# Patient Record
Sex: Male | Born: 1990
Health system: Southern US, Community
[De-identification: ages and names within clinical notes are randomized; demographics above are authoritative.]

---

## 2018-04-25 DIAGNOSIS — J101 Influenza due to other identified influenza virus with other respiratory manifestations: Secondary | ICD-10-CM | POA: Diagnosis not present

## 2018-04-29 DIAGNOSIS — J019 Acute sinusitis, unspecified: Secondary | ICD-10-CM | POA: Diagnosis not present

## 2018-04-29 DIAGNOSIS — R05 Cough: Secondary | ICD-10-CM | POA: Diagnosis not present

## 2018-05-02 ENCOUNTER — Emergency Department (HOSPITAL_COMMUNITY)
Admission: EM | Admit: 2018-05-02 | Discharge: 2018-05-02 | Disposition: A | Discharge status: Home / Self Care | Payer: 59 | Attending: Emergency Medicine | Admitting: Emergency Medicine

## 2018-05-02 ENCOUNTER — Emergency Department (HOSPITAL_COMMUNITY): Payer: 59

## 2018-05-02 ENCOUNTER — Encounter (HOSPITAL_COMMUNITY): Payer: Self-pay | Admitting: Emergency Medicine

## 2018-05-02 ENCOUNTER — Other Ambulatory Visit: Payer: Self-pay

## 2018-05-02 DIAGNOSIS — F419 Anxiety disorder, unspecified: Secondary | ICD-10-CM | POA: Insufficient documentation

## 2018-05-02 DIAGNOSIS — G4700 Insomnia, unspecified: Secondary | ICD-10-CM | POA: Diagnosis not present

## 2018-05-02 DIAGNOSIS — F430 Acute stress reaction: Secondary | ICD-10-CM | POA: Diagnosis not present

## 2018-05-02 DIAGNOSIS — F5101 Primary insomnia: Secondary | ICD-10-CM | POA: Diagnosis not present

## 2018-05-02 DIAGNOSIS — R4020 Unspecified coma: Secondary | ICD-10-CM | POA: Diagnosis not present

## 2018-05-02 LAB — CBC WITH DIFFERENTIAL/PLATELET
Abs Immature Granulocytes: 0.06 10*3/uL (ref 0.00–0.07)
Basophils Absolute: 0 10*3/uL (ref 0.0–0.1)
Basophils Relative: 0 %
EOS ABS: 0 10*3/uL (ref 0.0–0.5)
Eosinophils Relative: 0 %
HEMATOCRIT: 51.4 % (ref 39.0–52.0)
Hemoglobin: 17.5 g/dL — ABNORMAL HIGH (ref 13.0–17.0)
IMMATURE GRANULOCYTES: 1 %
Lymphocytes Relative: 12 %
Lymphs Abs: 1.4 10*3/uL (ref 0.7–4.0)
MCH: 28.3 pg (ref 26.0–34.0)
MCHC: 34 g/dL (ref 30.0–36.0)
MCV: 83 fL (ref 80.0–100.0)
Monocytes Absolute: 1 10*3/uL (ref 0.1–1.0)
Monocytes Relative: 8 %
NEUTROS PCT: 79 %
Neutro Abs: 9.3 10*3/uL — ABNORMAL HIGH (ref 1.7–7.7)
PLATELETS: 307 10*3/uL (ref 150–400)
RBC: 6.19 MIL/uL — ABNORMAL HIGH (ref 4.22–5.81)
RDW: 11.6 % (ref 11.5–15.5)
WBC: 11.8 10*3/uL — ABNORMAL HIGH (ref 4.0–10.5)
nRBC: 0 % (ref 0.0–0.2)

## 2018-05-02 LAB — URINALYSIS, ROUTINE W REFLEX MICROSCOPIC
GLUCOSE, UA: NEGATIVE mg/dL
HGB URINE DIPSTICK: NEGATIVE
Ketones, ur: 20 mg/dL — AB
LEUKOCYTES UA: NEGATIVE
Nitrite: NEGATIVE
PROTEIN: NEGATIVE mg/dL
SPECIFIC GRAVITY, URINE: 1.039 — AB (ref 1.005–1.030)
pH: 5 (ref 5.0–8.0)

## 2018-05-02 LAB — COMPREHENSIVE METABOLIC PANEL
ALBUMIN: 4.7 g/dL (ref 3.5–5.0)
ALT: 140 U/L — ABNORMAL HIGH (ref 0–44)
ANION GAP: 12 (ref 5–15)
AST: 59 U/L — AB (ref 15–41)
Alkaline Phosphatase: 85 U/L (ref 38–126)
BILIRUBIN TOTAL: 1.2 mg/dL (ref 0.3–1.2)
BUN: 16 mg/dL (ref 6–20)
CHLORIDE: 105 mmol/L (ref 98–111)
CO2: 22 mmol/L (ref 22–32)
Calcium: 9.5 mg/dL (ref 8.9–10.3)
Creatinine, Ser: 0.91 mg/dL (ref 0.61–1.24)
GFR calc Af Amer: >60 mL/min (ref >60)
GFR calc non Af Amer: >60 mL/min (ref >60)
GLUCOSE: 121 mg/dL — AB (ref 70–99)
POTASSIUM: 3.8 mmol/L (ref 3.5–5.1)
SODIUM: 139 mmol/L (ref 135–145)
Total Protein: 7.9 g/dL (ref 6.5–8.1)

## 2018-05-02 LAB — ETHANOL

## 2018-05-02 LAB — RAPID URINE DRUG SCREEN, HOSP PERFORMED
AMPHETAMINES: NOT DETECTED
BENZODIAZEPINES: NOT DETECTED
Barbiturates: NOT DETECTED
Cocaine: NOT DETECTED
Opiates: NOT DETECTED
TETRAHYDROCANNABINOL: NOT DETECTED

## 2018-05-02 MED ORDER — ZOLPIDEM TARTRATE 5 MG PO TABS: 5.0000 mg | ORAL_TABLET | Freq: Every evening | ORAL | 0 refills | Status: DC | PRN

## 2018-05-02 NOTE — Discharge Instructions (Addendum)
Your liver function tests were mildly elevated Follow up with a primary care doctor

## 2018-05-02 NOTE — ED Triage Notes (Signed)
Pt reports taking Melatonin and Nyquil without improvement.

## 2018-05-02 NOTE — ED Triage Notes (Signed)
Pt family reports that he was diagnosed with the flu over a week ago and has not been able to sleep since 04/27/18. Pt very slow to respond to questions at time of triage.

## 2018-05-02 NOTE — ED Provider Notes (Signed)
MSE was initiated and I personally evaluated the patient and placed orders (if any) at  9:19 PM on May 02, 2018.  The patient appears stable so that the remainder of the MSE may be completed by another provider.  Patient presents today with his wife for evaluation of not being able to sleep since 04/27/2018.  He was diagnosed over for a week ago with the flu and was given Tamiflu.  He is very slow to respond to questions.  History is mostly provided by patient's wife.  He does not have any known mental health history, denies any drug use.  He tells me that he has been seeing things and hearing things that he does not know if they are really there.  His wife reports that he is able to sleep for an hour here and an hour they are but has not had a consistent night of sleep in 5 days.  On my exam patient is awake, he is very slow to respond to questioning, but will answer questions appropriately.    CT head ordered, in addition to labs.  I stressed to patient and his wife the importance of staying for full evaluation and they stated their understanding.   Norman ClayHammond, Valine Drozdowski W, PA-C 05/02/18 2121    Lorre NickAllen, Anthony, MD 05/02/18 [phone]

## 2018-05-02 NOTE — ED Provider Notes (Signed)
[facility]-EMERGENCY DEPT Provider Note   CSN: [id number] Arrival date & time: 05/02/18  1910     History   Chief Complaint Chief Complaint  Patient presents with  . Insomnia    HPI James Burgess is a 27 y.o. male.  This is a 27 year old male who presents with insomnia x5 days.  Patient states he has been under a great deal of stress and anxiety recently.  Notes that when he tries a goal of sleep is less x-rays.  Denies any suicidal or homicidal ideations.  No auditory or visual hallucinations.  Denies any use of stimulants or illicit drugs.  Has attempted to use over-the-counter medications without relief.  No prior history of same.     History reviewed. No pertinent past medical history.  There are no active problems to display for this patient.   History reviewed. No pertinent surgical history.      Home Medications    Prior to Admission medications   Not on File    Family History History reviewed. No pertinent family history.  Social History Social History   Tobacco Use  . Smoking status: Never Smoker  . Smokeless tobacco: Never Used  Substance Use Topics  . Alcohol use: Never    Frequency: Never  . Drug use: Never     Allergies   Patient has no known allergies.   Review of Systems Review of Systems  All other systems reviewed and are negative.    Physical Exam Updated Vital Signs BP (!) 147/98 (BP Location: Left Arm)   Pulse 94   Temp 98 F (36.7 C) (Oral)   Resp 17   Ht 1.829 m (6')   Wt 81.6 kg   SpO2 97%   BMI 24.41 kg/m   Physical Exam  Constitutional: He is oriented to person, place, and time. He appears well-developed and well-nourished.  Non-toxic appearance. No distress.  HENT:  Head: Normocephalic and atraumatic.  Eyes: Pupils are equal, round, and reactive to light. Conjunctivae, EOM and lids are normal.  Neck: Normal range of motion. Neck supple. No tracheal deviation present. No thyroid mass  present.  Cardiovascular: Normal rate, regular rhythm and normal heart sounds. Exam reveals no gallop.  No murmur heard. Pulmonary/Chest: Effort normal and breath sounds normal. No stridor. No respiratory distress. He has no decreased breath sounds. He has no wheezes. He has no rhonchi. He has no rales.  Abdominal: Soft. Normal appearance and bowel sounds are normal. He exhibits no distension. There is no tenderness. There is no rebound and no CVA tenderness.  Musculoskeletal: Normal range of motion. He exhibits no edema or tenderness.  Neurological: He is alert and oriented to person, place, and time. He has normal strength. No cranial nerve deficit or sensory deficit. GCS eye subscore is 4. GCS verbal subscore is 5. GCS motor subscore is 6.  Skin: Skin is warm and dry. No abrasion and no rash noted.  Psychiatric: His affect is blunt. His speech is delayed. He is withdrawn.  Nursing note and vitals reviewed.    ED Treatments / Results  Labs (all labs ordered are listed, but only abnormal results are displayed) Labs Reviewed  CBC WITH DIFFERENTIAL/PLATELET - Abnormal; Notable for the following components:      Result Value   WBC 11.8 (*)    RBC 6.19 (*)    Hemoglobin 17.5 (*)    Neutro Abs 9.3 (*)    All other components within normal limits  COMPREHENSIVE METABOLIC PANEL  URINALYSIS, ROUTINE W REFLEX MICROSCOPIC  RAPID URINE DRUG SCREEN, HOSP PERFORMED  ETHANOL    EKG None  Radiology No results found.  Procedures Procedures (including critical care time)  Medications Ordered in ED Medications - No data to display   Initial Impression / Assessment and Plan / ED Course  I have reviewed the triage vital signs and the nursing notes.  Pertinent labs & imaging results that were available during my care of the patient were reviewed by me and considered in my medical decision making (see chart for details).     Pt with mild elevated lfts Instructed to follow up with  pcp Will rx ambien  Final Clinical Impressions(s) / ED Diagnoses   Final diagnoses:  None    ED Discharge Orders    None       Lorre Nick, MD 05/02/18 2240

## 2018-05-03 ENCOUNTER — Encounter (HOSPITAL_COMMUNITY): Payer: Self-pay

## 2018-05-03 ENCOUNTER — Other Ambulatory Visit: Payer: Self-pay

## 2018-05-03 ENCOUNTER — Emergency Department (HOSPITAL_COMMUNITY)
Admission: EM | Admit: 2018-05-03 | Discharge: 2018-05-04 | Disposition: A | Discharge status: Other Acute Inpatient Hospital | Payer: 59 | Attending: Emergency Medicine | Admitting: Emergency Medicine

## 2018-05-03 DIAGNOSIS — F329 Major depressive disorder, single episode, unspecified: Secondary | ICD-10-CM | POA: Diagnosis not present

## 2018-05-03 DIAGNOSIS — F99 Mental disorder, not otherwise specified: Secondary | ICD-10-CM | POA: Diagnosis present

## 2018-05-03 DIAGNOSIS — R45851 Suicidal ideations: Secondary | ICD-10-CM | POA: Diagnosis not present

## 2018-05-03 LAB — CBG MONITORING, ED: Glucose-Capillary: 100 mg/dL — ABNORMAL HIGH (ref 70–99)

## 2018-05-03 LAB — COMPREHENSIVE METABOLIC PANEL
ALBUMIN: 4.6 g/dL (ref 3.5–5.0)
ALT: 163 U/L — AB (ref 0–44)
AST: 68 U/L — AB (ref 15–41)
Alkaline Phosphatase: 81 U/L (ref 38–126)
Anion gap: 11 (ref 5–15)
BILIRUBIN TOTAL: 0.9 mg/dL (ref 0.3–1.2)
BUN: 19 mg/dL (ref 6–20)
CHLORIDE: 99 mmol/L (ref 98–111)
CO2: 27 mmol/L (ref 22–32)
CREATININE: 0.91 mg/dL (ref 0.61–1.24)
Calcium: 9.1 mg/dL (ref 8.9–10.3)
GFR calc Af Amer: >60 mL/min (ref >60)
GFR calc non Af Amer: >60 mL/min (ref >60)
GLUCOSE: 109 mg/dL — AB (ref 70–99)
POTASSIUM: 3.8 mmol/L (ref 3.5–5.1)
Sodium: 137 mmol/L (ref 135–145)
Total Protein: 7.9 g/dL (ref 6.5–8.1)

## 2018-05-03 LAB — RAPID URINE DRUG SCREEN, HOSP PERFORMED
AMPHETAMINES: NOT DETECTED
BARBITURATES: NOT DETECTED
BENZODIAZEPINES: NOT DETECTED
COCAINE: NOT DETECTED
Opiates: NOT DETECTED
TETRAHYDROCANNABINOL: NOT DETECTED

## 2018-05-03 LAB — AMMONIA: Ammonia: 15 umol/L (ref 9–35)

## 2018-05-03 LAB — CBC
HCT: 48.9 % (ref 39.0–52.0)
HEMOGLOBIN: 16.6 g/dL (ref 13.0–17.0)
MCH: 28.3 pg (ref 26.0–34.0)
MCHC: 33.9 g/dL (ref 30.0–36.0)
MCV: 83.3 fL (ref 80.0–100.0)
NRBC: 0 % (ref 0.0–0.2)
Platelets: 329 10*3/uL (ref 150–400)
RBC: 5.87 MIL/uL — ABNORMAL HIGH (ref 4.22–5.81)
RDW: 11.5 % (ref 11.5–15.5)
WBC: 10.9 10*3/uL — AB (ref 4.0–10.5)

## 2018-05-03 LAB — ETHANOL: Alcohol, Ethyl (B): <10 mg/dL (ref <10)

## 2018-05-03 LAB — ACETAMINOPHEN LEVEL: Acetaminophen (Tylenol), Serum: <10 ug/mL — ABNORMAL LOW (ref 10–30)

## 2018-05-03 LAB — SALICYLATE LEVEL: Salicylate Lvl: <7 mg/dL (ref 2.8–30.0)

## 2018-05-03 MED ORDER — AMMONIA AROMATIC IN INHA
RESPIRATORY_TRACT | Status: AC
  Filled 2018-05-03: qty 10

## 2018-05-03 MED ORDER — LORAZEPAM 2 MG/ML IJ SOLN
1.0000 mg | Freq: Once | INTRAMUSCULAR | Status: AC
  Administered 2018-05-03: 1 mg via INTRAMUSCULAR
  Filled 2018-05-03: qty 1

## 2018-05-03 NOTE — ED Notes (Signed)
While speaking with pt, all pt could say "I just want to die". Pt eventually did answer questions about having his parents come back to visit him. At this time, pt does not want to see family members.

## 2018-05-03 NOTE — ED Notes (Signed)
Pt alert and mildly confused. Pt laying in bed. Pt noted making jerking movements. Pt denies any si, hi, and avh at this time. Pt unable to make steady eys contact. Will continue to monitor.

## 2018-05-03 NOTE — ED Triage Notes (Signed)
Pt states, " I want to die, I want to go to jail." Pt was seen yesterday for insomnia.

## 2018-05-03 NOTE — ED Notes (Signed)
[id number] Expected date:  Expected time:  Means of arrival:  Comments: [place]

## 2018-05-03 NOTE — ED Provider Notes (Addendum)
[facility]-EMERGENCY DEPT Provider Note   CSN: [id number] Arrival date & time: 05/03/18  1620     History   Chief Complaint Chief Complaint  Patient presents with  . Suicidal    HPI James Burgess is a 27 y.o. male.  27 year old male here due to expressing thoughts of wanting to hurt himself.  Patient seen by myself yesterday for insomnia x5 days.  He notes increased stress and anxiety recently.  I prescribed patient Ambien which his parents state that he took and did get about 4 5 hours of sleep.  When he awoke patient was alert and oriented x3 but yet was very restless and expressed that he does want to die.  The patient himself does not have any history of psychiatric disorder but mother states that she does have a history of anxiety and that she presented very similar to this around his age.  Patient will not give any history currently due to his current state     History reviewed. No pertinent past medical history.  There are no active problems to display for this patient.   History reviewed. No pertinent surgical history.      Home Medications    Prior to Admission medications   Medication Sig Start Date End Date Taking? Authorizing Provider  zolpidem (AMBIEN) 5 MG tablet Take 1 tablet (5 mg total) by mouth at bedtime as needed for sleep. 05/02/18   Lorre NickAllen, Hasel Janish, MD    Family History History reviewed. No pertinent family history.  Social History Social History   Tobacco Use  . Smoking status: Never Smoker  . Smokeless tobacco: Never Used  Substance Use Topics  . Alcohol use: Never    Frequency: Never  . Drug use: Never     Allergies   Patient has no known allergies.   Review of Systems Review of Systems  Unable to perform ROS: Psychiatric disorder     Physical Exam Updated Vital Signs BP (!) 151/90 (BP Location: Right Arm)   Pulse 72   Temp 98.3 F (36.8 C) (Oral)   Resp 16   SpO2 98%   Physical Exam    Constitutional: He appears well-developed and well-nourished. He appears lethargic.  Non-toxic appearance. No distress.  HENT:  Head: Normocephalic and atraumatic.  Eyes: Pupils are equal, round, and reactive to light. Conjunctivae, EOM and lids are normal.  Neck: Normal range of motion. Neck supple. No tracheal deviation present. No thyroid mass present.  Cardiovascular: Normal rate, regular rhythm and normal heart sounds. Exam reveals no gallop.  No murmur heard. Pulmonary/Chest: Effort normal and breath sounds normal. No stridor. No respiratory distress. He has no decreased breath sounds. He has no wheezes. He has no rhonchi. He has no rales.  Abdominal: Soft. Normal appearance and bowel sounds are normal. He exhibits no distension. There is no tenderness. There is no rebound and no CVA tenderness.  Musculoskeletal: Normal range of motion. He exhibits no edema or tenderness.  Neurological: He appears lethargic. He displays no tremor. No sensory deficit. He exhibits normal muscle tone. GCS eye subscore is 4. GCS verbal subscore is 3. GCS motor subscore is 5.  Patient uncooperative with exam  Skin: Skin is warm and dry. No abrasion and no rash noted.  Psychiatric: He is inattentive.  Nursing note and vitals reviewed.    ED Treatments / Results  Labs (all labs ordered are listed, but only abnormal results are displayed) Labs Reviewed  CBG MONITORING, ED - Abnormal; Notable  for the following components:      Result Value   Glucose-Capillary 100 (*)    All other components within normal limits  COMPREHENSIVE METABOLIC PANEL  ETHANOL  SALICYLATE LEVEL  ACETAMINOPHEN LEVEL  CBC  RAPID URINE DRUG SCREEN, HOSP PERFORMED  AMMONIA    EKG None  Radiology Ct Head Wo Contrast  Result Date: 05/02/2018 CLINICAL DATA:  Altered LOC EXAM: CT HEAD WITHOUT CONTRAST TECHNIQUE: Contiguous axial images were obtained from the base of the skull through the vertex without intravenous contrast.  COMPARISON:  None. FINDINGS: Brain: No evidence of acute infarction, hemorrhage, hydrocephalus, extra-axial collection or mass lesion/mass effect. Vascular: No hyperdense vessel or unexpected calcification. Skull: Normal. Negative for fracture or focal lesion. Sinuses/Orbits: No acute finding. Other: None IMPRESSION: Negative non contrasted CT appearance of the brain Electronically Signed   By: Jasmine Pang M.D.   On: 05/02/2018 21:57    Procedures Procedures (including critical care time)  Medications Ordered in ED Medications  LORazepam (ATIVAN) injection 1 mg (has no administration in time range)     Initial Impression / Assessment and Plan / ED Course  I have reviewed the triage vital signs and the nursing notes.  Pertinent labs & imaging results that were available during my care of the patient were reviewed by me and considered in my medical decision making (see chart for details).     Patient's mild transaminitis is essentially unchanged.  Patient had a head CT yesterday which was negative.  His ammonia level is negative.  Suspect psychiatric etiology of his current symptoms.  Patient medically cleared for psychiatric disposition  7:19 PM Patient rechecked and he is more conversational now after receiving Ativan.  Final Clinical Impressions(s) / ED Diagnoses   Final diagnoses:  None    ED Discharge Orders    None       Lorre Nick, MD 05/03/18 1901    Lorre Nick, MD 05/03/18 1920

## 2018-05-03 NOTE — ED Notes (Signed)
Nurse was bringing blood draw supplies and a chair down the hall to draw patient's lab samples.  Patient was standing initially but when he saw the chair he sat down in it.  Nurse and tech proceeded to roll him into room, then patient started sliding out of chair.  Nurse and tech lowered him to the floor and patient's body went limp.  Patient's eyes were open and blinking occasionally.  Dr. Freida BusmanAllen notified and vitals and CBG obtained.  Dr. Freida BusmanAllen back in unit assessing patient.  Labs drawn by nurse and warm blanket put on patient.  Patient is non-verbal at this time and having jerking motions to his upper torso while Dr. Freida BusmanAllen in the room.

## 2018-05-04 ENCOUNTER — Inpatient Hospital Stay (HOSPITAL_COMMUNITY)
Admission: AD | Admit: 2018-05-04 | Discharge: 2018-05-09 | DRG: 885 | Disposition: A | Discharge status: Home / Self Care | Payer: 59 | Source: Intra-hospital | Attending: Emergency Medicine | Admitting: Emergency Medicine

## 2018-05-04 ENCOUNTER — Encounter (HOSPITAL_COMMUNITY): Payer: Self-pay

## 2018-05-04 DIAGNOSIS — F2081 Schizophreniform disorder: Principal | ICD-10-CM | POA: Diagnosis present

## 2018-05-04 DIAGNOSIS — R197 Diarrhea, unspecified: Secondary | ICD-10-CM | POA: Diagnosis not present

## 2018-05-04 DIAGNOSIS — G4700 Insomnia, unspecified: Secondary | ICD-10-CM | POA: Diagnosis not present

## 2018-05-04 DIAGNOSIS — F23 Brief psychotic disorder: Secondary | ICD-10-CM | POA: Diagnosis not present

## 2018-05-04 DIAGNOSIS — R509 Fever, unspecified: Secondary | ICD-10-CM | POA: Diagnosis not present

## 2018-05-04 DIAGNOSIS — F329 Major depressive disorder, single episode, unspecified: Secondary | ICD-10-CM | POA: Diagnosis not present

## 2018-05-04 DIAGNOSIS — Z818 Family history of other mental and behavioral disorders: Secondary | ICD-10-CM

## 2018-05-04 DIAGNOSIS — F419 Anxiety disorder, unspecified: Secondary | ICD-10-CM | POA: Diagnosis not present

## 2018-05-04 MED ORDER — HYDROXYZINE HCL 25 MG PO TABS
25.0000 mg | ORAL_TABLET | Freq: Three times a day (TID) | ORAL | Status: DC | PRN
  Administered 2018-05-04: 25 mg via ORAL
  Filled 2018-05-04 (×2): qty 1

## 2018-05-04 MED ORDER — TRAZODONE HCL 50 MG PO TABS
50.0000 mg | ORAL_TABLET | Freq: Every evening | ORAL | Status: DC | PRN
  Administered 2018-05-04: 50 mg via ORAL
  Filled 2018-05-04 (×2): qty 1

## 2018-05-04 MED ORDER — OLANZAPINE 5 MG PO TBDP
5.0000 mg | ORAL_TABLET | Freq: Once | ORAL | Status: AC
  Administered 2018-05-04: 5 mg via ORAL
  Filled 2018-05-04 (×2): qty 1

## 2018-05-04 MED ORDER — ALUM & MAG HYDROXIDE-SIMETH 200-200-20 MG/5ML PO SUSP: 30.0000 mL | ORAL | Status: DC | PRN

## 2018-05-04 MED ORDER — ACETAMINOPHEN 325 MG PO TABS
650.0000 mg | ORAL_TABLET | Freq: Four times a day (QID) | ORAL | Status: DC | PRN
  Administered 2018-05-06: 650 mg via ORAL
  Filled 2018-05-04: qty 2

## 2018-05-04 MED ORDER — MAGNESIUM HYDROXIDE 400 MG/5ML PO SUSP: 30.0000 mL | Freq: Every day | ORAL | Status: DC | PRN

## 2018-05-04 NOTE — ED Notes (Signed)
Nira ConnJason Berry, NP, patient meets inpatient criteria. [facility] to secure placement.

## 2018-05-04 NOTE — Tx Team (Signed)
Initial Treatment Plan 05/04/2018 2:45 PM James RoanMatthew Vanderstelt [id number]    PATIENT STRESSORS: Health problems   PATIENT STRENGTHS: Average or above average intelligence Capable of independent living Supportive family/friends   PATIENT IDENTIFIED PROBLEMS: "stop hearing things"  " sleep"                   DISCHARGE CRITERIA:  Ability to meet basic life and health needs Improved stabilization in mood, thinking, and/or behavior Medical problems require only outpatient monitoring  PRELIMINARY DISCHARGE PLAN: Return to previous living arrangement  PATIENT/FAMILY INVOLVEMENT: This treatment plan has been presented to and reviewed with the patient, James Burgess.  The patient and family have been given the opportunity to ask questions and make suggestions.  Raylene MiyamotoMichael R Floella Ensz, RN 05/04/2018, 2:45 PM

## 2018-05-04 NOTE — ED Notes (Signed)
Pt was observed to be sitting on the floow in hallway outside his room. Once fire alarm in building was activated, pt was redirected into room in order to close door to comply with fire safety. Pt slowly pushed himself by his arms (while remaining on floor) into his room. Pt remained on floor next to bed. This Clinical research associatewriter prompted pt to get up and sit in chair. Pt stared blankly at this writer and continued to sit on floor.

## 2018-05-04 NOTE — ED Notes (Signed)
Pt discharged safely with [facility] .  All belongings were sent with pt.

## 2018-05-04 NOTE — ED Notes (Addendum)
Pt walking in hallway. Pt was redirected from going into an empty room. Pt appears confused, unable to form complete sentences when trying to speak with Clinical research associatewriter. Pt continues to bring water pitcher to this writer even though he was just given fresh water. This Clinical research associatewriter asked pt what he was in need of; pt was unable to answer, appears to be thought blocking. Pt is alert and does respond to this writers questions by looking at Clinical research associatewriter.

## 2018-05-04 NOTE — BH Assessment (Signed)
Assessment Note  James Burgess is an 27 y.o. male presenting with SI. Clinician attempted to complete assessment questions. Patient stated, "Shannan Harper me, I want you to kill me... put me in jail", clinician asked why and if patient has been suicidal or homicidal, patient stated, "this is a confessional". Patient wasn't able to tell me what he was confessing, stating, "I don't know what I did, I have no recollection I blacked out". Patient then only made statements over and over stating, "I am weak I am not functioning, I am weak". Patient became unable to assess, as he did not answer anymore questions. Patient laid in bed looking straight up at ceiling blinking his eyes throughout attempted assessment.   Patient was not oriented during assessment. Patient displayed poor eye contact never looking at clinician, staring at the ceiling and blinking eyes repeatedly. Affect and mood was of despair and sadness. Patient judgement was impaired. Patient was repetitive in speech saying statements over and over again.  UDS negative ETOH negative  EDP Note: 27 year old male here due to expressing thoughts of wanting to hurt himself.  Patient seen by myself yesterday for insomnia x5 days.  He notes increased stress and anxiety recently.  I prescribed patient Ambien which his parents state that he took and did get about 4 5 hours of sleep.  When he awoke patient was alert and oriented x3 but yet was very restless and expressed that he does want to die.  The patient himself does not have any history of psychiatric disorder but mother states that she does have a history of anxiety and that she presented very similar to this around his age.  Patient will not give any history currently due to his current state.  Diagnosis: Major Depressive Disorder  Past Medical History: History reviewed. No pertinent past medical history.  History reviewed. No pertinent surgical history.  Family History: History reviewed. No pertinent  family history.  Social History:  reports that he has never smoked. He has never used smokeless tobacco. He reports that he does not drink alcohol or use drugs.  Additional Social History:  Alcohol / Drug Use Pain Medications: see MAR Prescriptions: see MAR Over the Counter: see MAR  CIWA: CIWA-Ar BP: (!) 142/88 Pulse Rate: 88 COWS:    Allergies: No Known Allergies  Home Medications:  (Not in a hospital admission)  OB/GYN Status:  No LMP for male patient.  General Assessment Data Location of Assessment: [facility] TTS Assessment: In system Is this a Tele or Face-to-Face Assessment?: Face-to-Face Is this an Initial Assessment or a Re-assessment for this encounter?: Initial Assessment Patient Accompanied by:: N/A Language Other than English: No Living Arrangements: (unable to assess) What gender do you identify as?: Male Marital status: Single Living Arrangements: Parent Can pt return to current living arrangement?: Yes Admission Status: Voluntary Is patient capable of signing voluntary admission?: Yes Referral Source: Self/Family/Friend     Crisis Care Plan Living Arrangements: Parent Legal Guardian: Mother Name of Psychiatrist: (unable to assess) Name of Therapist: (unable to assess)  Education Status Is patient currently in school?: (unable to assess)  Risk to self with the past 6 months Suicidal Ideation: Yes-Currently Present Has patient been a risk to self within the past 6 months prior to admission? : (unable to assess) Suicidal Intent: Yes-Currently Present Has patient had any suicidal intent within the past 6 months prior to admission? : No Is patient at risk for suicide?: Yes Suicidal Plan?: (unable to assess) Has patient had any  suicidal plan within the past 6 months prior to admission? : (unable to assess) Access to Means: (unable to assess) What has been your use of drugs/alcohol within the last 12 months?: (unable to assess) Previous Attempts/Gestures:  (unable to assess) How many times?: (unable to assess) Other Self Harm Risks: (unable to assess) Triggers for Past Attempts: (unable to assess) Intentional Self Injurious Behavior: (unable to assess) Family Suicide History: Unable to assess Recent stressful life event(s): (unable to assess) Persecutory voices/beliefs?: No Depression: Yes Depression Symptoms: (unable to assess) Substance abuse history and/or treatment for substance abuse?: (unable to assess)  Risk to Others within the past 6 months Homicidal Ideation: (unable to assess) Does patient have any lifetime risk of violence toward others beyond the six months prior to admission? : No Thoughts of Harm to Others: No Current Homicidal Intent: No Current Homicidal Plan: No Access to Homicidal Means: No Identified Victim: (unable to assess) History of harm to others?: No Assessment of Violence: None Noted Violent Behavior Description: (unable to assess) Does patient have access to weapons?: (unable to assess) Criminal Charges Pending?: No Does patient have a court date: No Is patient on probation?: No  Psychosis Hallucinations: (unable to assess) Delusions: Unspecified  Mental Status Report Appearance/Hygiene: Unable to Assess, Unremarkable Eye Contact: Poor Motor Activity: Unable to assess Speech: Logical/coherent Level of Consciousness: Drowsy Mood: Despair Affect: Sad Anxiety Level: Minimal Thought Processes: Irrelevant Judgement: Impaired Orientation: Unable to assess Obsessive Compulsive Thoughts/Behaviors: Unable to Assess  Cognitive Functioning Concentration: Unable to Assess Memory: Unable to Assess Is patient IDD: No Insight: Unable to Assess Impulse Control: Poor Appetite: (unable to assess) Have you had any weight changes? : (unable to assess) Sleep: Unable to Assess Total Hours of Sleep: (unable to assess) Vegetative Symptoms: None  ADLScreening [facility]) Patient's cognitive  ability adequate to safely complete daily activities?: Yes Patient able to express need for assistance with ADLs?: Yes Independently performs ADLs?: Yes (appropriate for developmental age)  Prior Inpatient Therapy Prior Inpatient Therapy: No  Prior Outpatient Therapy Prior Outpatient Therapy: (unable to assess)  ADL Screening (condition at time of admission) Patient's cognitive ability adequate to safely complete daily activities?: Yes Patient able to express need for assistance with ADLs?: Yes Independently performs ADLs?: Yes (appropriate for developmental age)   Disposition:  Disposition Initial Assessment Completed for this Encounter: Yes  Nira ConnJason Berry, NP, patient meets inappatient criteria. [facility], RN, no appropriate beds available. TTS to secure placement.  Burnetta SabinLatisha D Elenore Wanninger, [facility] 05/04/2018 1:55 AM

## 2018-05-04 NOTE — ED Notes (Signed)
Patient appears confused and thought blocking.  Pt stated that he thought he was here for pneumonia.  He also may be experiencing  Visual hallucinations because he stated that " It looks like someone tried to claw there way out of here."  "Like blood streaks everywhere."  Pt was assured of his safety.  We will continue to monitor pt by 15 minute checks and video monitoring.

## 2018-05-04 NOTE — BH Assessment (Signed)
[facility]  Per Juanetta BeetsJacqueline Norman, DO, this pt requires psychiatric hospitalization.  Berneice Heinrichina Tate, RN, [id number]  Dr Sharma CovertNorman also finds that pt meets criteria for IVC, which she has initiated.  [place] [facility] [place], and at [employer]12:42 Magistrate Watts confirms receipt.  He has since faxed Findings and Custody Order to this Clinical research associatewriter.  At 12:50 I called [employer] and spoke to [employer] [id number], who took demographic information, agreeing to dispatch law enforcement to fill out Return of Service.  Completion of Return of Service is pending as of this writing.  Pt's nurse, Kendal Hymendie, has been notified, and agrees to call report to [phone].  Pt is to be transported via Patent examinerlaw enforcement.   Doylene Canninghomas Jachai Okazaki, [place] Behavioral Health Coordinator [phone]

## 2018-05-04 NOTE — Progress Notes (Signed)
Patient ID: James RoanMatthew Kloehn, male   DOB: December 23, 1990, 27 y.o.   MRN: [id number]  Is a 27 yo male that presents IVC on 05/04/18 with increasing thoughts of paranoia, persecution, and what he feels are auditory hallucinations. Pt's girlfriend states that he has kept repeating himself that he thinks he has been abusing her and the cops are coming to get him. He also will hear sounds and think he is the only one hearing them and is hallucinating. Pt seems like he is thought blocking during the assessment and continues to rock back and forth. Pt is unable to sign his paperwork at this time. Most of the information from the interview was obtained from his mother, Gaylyn RongKris and his live in girlfriend, Marchelle Folksmanda. They state that he has had the flu for the past 7 days and took theraflu and nyquil. Pt hasn't been sleeping well from their report. Pt's gf states that he doesn't use/abuse drugs/tobacco/vape/Rx medications. Pt is stated as having a glass of wine "every once in awhile". Pt's mother stated she had a psychotic break at a similar age from anxiety. Pt's family denies that the pt has any present/history of sexual/verbal/sexual abuse. Pt has not had the flu vaccine this year. Pt states he is having pain in his back that is generalized but cannot rate it at this time. Pt has NKA and doesn't take any home meds. Pt has no surgical history. Pt has no PCP but does have a dentist that he saw 8 months ago.   Consents signed, skin/belongings search completed and patient oriented to unit. Patient stable at this time. Patient given the opportunity to express concerns and ask questions. Patient given toiletries. Will continue to monitor.

## 2018-05-05 DIAGNOSIS — G4700 Insomnia, unspecified: Secondary | ICD-10-CM

## 2018-05-05 DIAGNOSIS — F23 Brief psychotic disorder: Secondary | ICD-10-CM

## 2018-05-05 DIAGNOSIS — F419 Anxiety disorder, unspecified: Secondary | ICD-10-CM

## 2018-05-05 MED ORDER — CLONAZEPAM 0.5 MG PO TABS
0.5000 mg | ORAL_TABLET | Freq: Three times a day (TID) | ORAL | Status: DC
  Administered 2018-05-05 – 2018-05-06 (×3): 0.5 mg via ORAL
  Filled 2018-05-05 (×3): qty 1

## 2018-05-05 MED ORDER — RISPERIDONE 1 MG PO TABS
1.0000 mg | ORAL_TABLET | Freq: Two times a day (BID) | ORAL | Status: DC
  Administered 2018-05-05: 1 mg via ORAL
  Filled 2018-05-05 (×3): qty 1

## 2018-05-05 MED ORDER — TEMAZEPAM 15 MG PO CAPS
30.0000 mg | ORAL_CAPSULE | Freq: Every day | ORAL | Status: DC
  Administered 2018-05-06 – 2018-05-08 (×3): 30 mg via ORAL
  Filled 2018-05-05 (×4): qty 2

## 2018-05-05 MED ORDER — RISPERIDONE 2 MG PO TABS
2.0000 mg | ORAL_TABLET | Freq: Two times a day (BID) | ORAL | Status: DC
  Administered 2018-05-05 – 2018-05-06 (×2): 2 mg via ORAL
  Filled 2018-05-05 (×3): qty 1

## 2018-05-05 NOTE — Plan of Care (Signed)
Progress note  D: pt found in bed; allowed to rest. Pt compliant with medication administration. Pt is still delusional and fixated on persecutory notions of being here because of his job. Pt will have tangible conversations but then will digress to thoughts of death or impending doom. Pt denies any si/hi/ah/vh but seems to be responding to internal stimuli or thought blocking at times. Pt agrees to approach staff before [facility].  A: pt provided support and encouragement. Pt given medication per protocol and standing orders. Q5928m safety checks implemented and continued. R: pt safe on the unit; will continue to monitor.   Pt progressing in the following metrics  Problem: Education: Goal: Knowledge of [facility] Education information/materials will improve Outcome: Progressing Goal: Emotional status will improve Outcome: Progressing Goal: Mental status will improve Outcome: Progressing Goal: Verbalization of understanding the information provided will improve Outcome: Progressing

## 2018-05-05 NOTE — Progress Notes (Signed)
Pt observed resting in bed with eyes closed. Respirations even and unlabored. Will accesses needs when Pt wakes up. Will continue with POC.

## 2018-05-05 NOTE — Progress Notes (Signed)
   05/05/18 0500  Precautions / Armbands  Precautions Fall risk;Other (Comment) (unsteady gait; unpredictable behavior )  Patient armbands applied: Patient Identification (White);Fall risk (Yellow)  Adult Fall Risk Assessment  Risk Factor Category (scoring not indicated) High fall risk per protocol (document High fall risk)  Patient Fall Risk Level High fall risk  Adult Fall Risk Interventions  Required Bundle Interventions *See Row Information* High fall risk - low, moderate, and high requirements implemented  Additional Interventions Assess orthostatic BP  Safe Patient Handling Required Documentation-Repositioning Needs  Assist with movement in bed No  Fragile skin with pressure ulcer No  Unresponsive No  Safe Patient Handling Assessment  Ambulates independently Yes, no lift needed  Safe Environment  Patient oriented to unit and equipment Yes

## 2018-05-05 NOTE — Progress Notes (Signed)
Pt was observed walking backwards from room to med window to received meds. Pt states he can only walk backwards. MHT was in proximity to Pt. Pt remains a HFR due to unsteady gait. Will monitor.

## 2018-05-05 NOTE — [facility] Counselor (Signed)
CSW attempted to meet with pt. Pt sleeping heavily and unable to be roused. Per MD, pt would benefit from not being woken up, as he has not slept in several days and was demonstrating psychotic symptoms. CSW will attempt to complete assessment on Friday, 11/29.  Aubrynn Katona S. Alan RipperHolloway, MSW, LCSW Clinical Social Worker 05/05/2018 9:05 AM

## 2018-05-05 NOTE — H&P (Addendum)
Psychiatric Admission Assessment Adult  Patient Identification: James Burgess MRN:  [id number] Date of Evaluation:  05/05/2018 Chief Complaint:  MDD WITH PSYCHOTIC FEATURES                                                                                                                                                                              Principal Diagnosis: Insomnia x5 days/psychotic symptoms/disorganized thought , and behavior/paranoia in the context of a negative drug screen and no prior psychiatric diagnoses as far as we know Diagnosis:  Active Problems:   Brief psychotic disorder (HCC)  History of Present Illness:   Apparently this 27 year old single patient with his normal state of health until he got the flu about a week ago he did take Tamiflu and NyQuil and then began suffering from some insomnia, this progressed to the point of hallucinations, delusional statements, and disorganized behavior noted in the emergency department. Patient acknowledges stress he is unable to give me much valid history. He did express several delusions that while hospitalized in the ER, and he is now sleeping and difficult to arouse. He did receive trazodone and Zyprexa But these were fairly low doses.  When I arouse him he can give me mumbling incoherent answers and falls back asleep  By 10 AM James Burgess was awake.  He was highly anxious.  He stared ahead at the ceiling and he moved his eyes as if he were hallucinating but kept saying he was just looking at the ceiling.  His speech was halting and rare.  He was very anxious and was unaware of how he got here.  He was oriented to person he knew he was hospitalized but states he was here "to be knocked out" he also stated he was "wearing a wire" and asked if there was a wire on him.  Therefore he was resuming some paranoid delusions. He told me he worked at [employer] [place] but could not state his specific job. He did not answer most questions again  was highly anxious and probably hallucinating but was certainly delusional.  Not fully oriented. He denied wanting to harm himself when specifically asked  Associated Signs/Symptoms: Depression Symptoms:  insomnia, (Hypo) Manic Symptoms:  Delusions, Anxiety Symptoms:  ukn Psychotic Symptoms:  Delusions, PTSD Symptoms: ukn Total Time spent with patient: 30 minutes  Past Psychiatric History: ukn  Is the patient at risk to self? Yes.    Has the patient been a risk to self in the past 6 months? No.  Has the patient been a risk to self within the distant past? No.  Is the patient a risk to others? No.  Has the patient been a risk to others in the past 6  months? No.  Has the patient been a risk to others within the distant past? No.   Prior Inpatient Therapy:   Prior Outpatient Therapy:    Alcohol Screening: 1. How often do you have a drink containing alcohol?: Never 2. How many drinks containing alcohol do you have on a typical day when you are drinking?: 1 or 2 3. How often do you have six or more drinks on one occasion?: Never AUDIT-C Score: 0 4. How often during the last year have you found that you were not able to stop drinking once you had started?: Never 5. How often during the last year have you failed to do what was normally expected from you becasue of drinking?: Never 6. How often during the last year have you needed a first drink in the morning to get yourself going after a heavy drinking session?: Never 7. How often during the last year have you had a feeling of guilt of remorse after drinking?: Never 8. How often during the last year have you been unable to remember what happened the night before because you had been drinking?: Never 9. Have you or someone else been injured as a result of your drinking?: No 10. Has a relative or friend or a doctor or another health worker been concerned about your drinking or suggested you cut down?: No Alcohol Use Disorder Identification  Test Final Score (AUDIT): 0 Substance Abuse History in the last 12 months:  No. Consequences of Substance Abuse: Negative Previous Psychotropic Medications: No  Psychological Evaluations: No  Past Medical History: History reviewed. No pertinent past medical history. History reviewed. No pertinent surgical history. Family History: History reviewed. No pertinent family history. Family Psychiatric  History: Mother reports a similar stress reaction with insomnia but I am unable to reach her by phone Tobacco Screening:   Social History:  Social History   Substance and Sexual Activity  Alcohol Use Yes  . Frequency: Never   Comment: occassional glass of wine     Social History   Substance and Sexual Activity  Drug Use Never    Additional Social History:                           Allergies:  No Known Allergies Lab Results:  Results for orders placed or performed during the hospital encounter of 05/03/18 (from the past 48 hour(s))  Rapid urine drug screen (hospital performed)     Status: None   Collection Time: 05/03/18  4:42 PM  Result Value Ref Range   Opiates NONE DETECTED NONE DETECTED   Cocaine NONE DETECTED NONE DETECTED   Benzodiazepines NONE DETECTED NONE DETECTED   Amphetamines NONE DETECTED NONE DETECTED   Tetrahydrocannabinol NONE DETECTED NONE DETECTED   Barbiturates NONE DETECTED NONE DETECTED    Comment: (NOTE) DRUG SCREEN FOR MEDICAL PURPOSES ONLY.  IF CONFIRMATION IS NEEDED FOR ANY PURPOSE, NOTIFY LAB WITHIN 5 DAYS. LOWEST DETECTABLE LIMITS FOR URINE DRUG SCREEN Drug Class                     Cutoff (ng/mL) Amphetamine and metabolites    1000 Barbiturate and metabolites    200 Benzodiazepine                 200 Tricyclics and metabolites     300 Opiates and metabolites        300 Cocaine and metabolites        300 THC  50 Performed at [facility], [address], [place], [place] [address]   CBG  monitoring, ED     Status: Abnormal   Collection Time: 05/03/18  5:09 PM  Result Value Ref Range   Glucose-Capillary 100 (H) 70 - 99 mg/dL  Comprehensive metabolic panel     Status: Abnormal   Collection Time: 05/03/18  5:21 PM  Result Value Ref Range   Sodium 137 135 - 145 mmol/L   Potassium 3.8 3.5 - 5.1 mmol/L   Chloride 99 98 - 111 mmol/L   CO2 27 22 - 32 mmol/L   Glucose, Bld 109 (H) 70 - 99 mg/dL   BUN 19 6 - 20 mg/dL   Creatinine, Ser 6.96 0.61 - 1.24 mg/dL   Calcium 9.1 8.9 - 29.5 mg/dL   Total Protein 7.9 6.5 - 8.1 g/dL   Albumin 4.6 3.5 - 5.0 g/dL   AST 68 (H) 15 - 41 U/L   ALT 163 (H) 0 - 44 U/L   Alkaline Phosphatase 81 38 - 126 U/L   Total Bilirubin 0.9 0.3 - 1.2 mg/dL   GFR calc non Af Amer >60 >60 mL/min   GFR calc Af Amer >60 >60 mL/min   Anion gap 11 5 - 15    Comment: Performed at [facility], [address], [place], [place] [address]  Ethanol     Status: None   Collection Time: 05/03/18  5:21 PM  Result Value Ref Range   Alcohol, Ethyl (B) <10 <10 mg/dL    Comment: (NOTE) Lowest detectable limit for serum alcohol is 10 mg/dL. For medical purposes only. Performed at [facility], [address] [place], [place] [address]   Salicylate level     Status: None   Collection Time: 05/03/18  5:21 PM  Result Value Ref Range   Salicylate Lvl <7.0 2.8 - 30.0 mg/dL    Comment: Performed at [facility], [address] [place], [place] [address]  Acetaminophen level     Status: Abnormal   Collection Time: 05/03/18  5:21 PM  Result Value Ref Range   Acetaminophen (Tylenol), Serum <10 (L) 10 - 30 ug/mL    Comment: (NOTE) Therapeutic concentrations vary significantly. A range of 10-30 ug/mL  may be an effective concentration for many patients. However, some  are best treated at concentrations outside of this range. Acetaminophen concentrations >150 ug/mL at 4 hours after ingestion  and >50 ug/mL at 12  hours after ingestion are often associated with  toxic reactions. Performed at [facility], [address], [place], [place] [address]   cbc     Status: Abnormal   Collection Time: 05/03/18  5:21 PM  Result Value Ref Range   WBC 10.9 (H) 4.0 - 10.5 K/uL   RBC 5.87 (H) 4.22 - 5.81 MIL/uL   Hemoglobin 16.6 13.0 - 17.0 g/dL   HCT 03.4 74.2 - 59.5 %   MCV 83.3 80.0 - 100.0 fL   MCH 28.3 26.0 - 34.0 pg   MCHC 33.9 30.0 - 36.0 g/dL   RDW 63.8 75.6 - 43.3 %   Platelets 329 150 - 400 K/uL   nRBC 0.0 0.0 - 0.2 %    Comment: Performed at [facility], [address], [place], [place] [address]  Ammonia     Status: None   Collection Time: 05/03/18  5:55 PM  Result Value Ref Range   Ammonia 15 9 - 35  umol/L    Comment: Performed at [facility], [address], [place], [place] [address]    Blood Alcohol level:  Lab Results  Component Value Date   ETH <10 05/03/2018   ETH <10 05/02/2018    Metabolic Disorder Labs:  No results found for: HGBA1C, MPG No results found for: PROLACTIN No results found for: CHOL, TRIG, HDL, CHOLHDL, VLDL, LDLCALC  Current Medications: Current Facility-Administered Medications  Medication Dose Route Frequency Provider Last Rate Last Dose  . acetaminophen (TYLENOL) tablet 650 mg  650 mg Oral Q6H PRN Money, Gerlene Burdock, FNP      . alum & mag hydroxide-simeth (MAALOX/MYLANTA) 200-200-20 MG/5ML suspension 30 mL  30 mL Oral Q4H PRN Money, Gerlene Burdock, FNP      . hydrOXYzine (ATARAX/VISTARIL) tablet 25 mg  25 mg Oral TID PRN Money, Gerlene Burdock, FNP   25 mg at 05/04/18 2236  . magnesium hydroxide (MILK OF MAGNESIA) suspension 30 mL  30 mL Oral Daily PRN Money, Gerlene Burdock, FNP      . risperiDONE (RISPERDAL) tablet 1 mg  1 mg Oral BID Malvin Johns, MD      . temazepam (RESTORIL) capsule 30 mg  30 mg Oral QHS Malvin Johns, MD      . traZODone (DESYREL) tablet 50 mg  50 mg Oral QHS PRN Money, Gerlene Burdock, FNP   50 mg at  05/04/18 2235   PTA Medications: Medications Prior to Admission  Medication Sig Dispense Refill Last Dose  . amoxicillin-clavulanate (AUGMENTIN) 875-125 MG tablet    Completed Course at Unknown time  . benzonatate (TESSALON) 100 MG capsule    Completed Course at Unknown time  . ipratropium (ATROVENT) 0.06 % nasal spray    Completed Course at Unknown time  . Omega-3 Fatty Acids (FISH OIL) 1000 MG CAPS Take 1,000 mg by mouth daily.   Past Month at Unknown time  . promethazine-dextromethorphan (PROMETHAZINE-DM) 6.25-15 MG/5ML syrup    Completed Course at Unknown time  . zolpidem (AMBIEN) 5 MG tablet Take 1 tablet (5 mg total) by mouth at bedtime as needed for sleep. 10 tablet 0 has not filled    Musculoskeletal: Strength & Muscle Tone: flaccid Gait & Station: unsteady Patient leans: N/A  Psychiatric Specialty Exam: Physical Exam pulse 112 lungs are clear sinus tachycardia  ROS to participate  Blood pressure 135/90, pulse 85, temperature 98.8 F (37.1 C), temperature source Oral, resp. rate 20, height 6\' 2"  (1.88 m), weight 83 kg, SpO2 100 %.Body mass index is 23.5 kg/m.  General Appearance: Disheveled  Eye Contact:  None  Speech:  Mumbles incoherently when aroused  Volume:  Decreased  Mood:  Dysphoric  Affect:  Congruent  Thought Process:  Disorganized  Orientation:  NA  Thought Content:  Unknown at present  Suicidal Thoughts:  Unknown at present  Homicidal Thoughts:  Unknown at present  Memory:  NA  Judgement:  Impaired  Insight:  Lacking  Psychomotor Activity:  Decreased  Concentration:  Concentration: Poor  Recall:  Poor  Fund of Knowledge:  Poor  Language:  Poor  Akathisia:  No  Handed:  Right  AIMS (if indicated):     Assets:  Social Support  ADL's:  Impaired  Cognition: unable to Fully assess  Sleep:  Number of Hours: 5.75    Treatment Plan Summary: Daily contact with patient to assess and evaluate symptoms and progress in treatment and Medication  management  Observation Level/Precautions:  15 minute checks  Laboratory:  Done  through ER  Psychotherapy: Reality based  Sleep aid for medication as well as low-dose Risperdal  Consultations: None needed at present  Discharge Concerns: Solution of psychosis  Estimated LOS: 3 days  Other: Not applicable   Physician Treatment Plan for Primary Diagnosis: <principal problem not specified> Long Term Goal(s): Improvement in symptoms so as ready for discharge  Short Term Goals: Ability to identify changes in lifestyle to reduce recurrence of condition will improve  Physician Treatment Plan for Secondary Diagnosis: Active Problems:   Brief psychotic disorder (HCC)  Long Term Goal(s): Improvement in symptoms so as ready for discharge  Short Term Goals: Ability to identify changes in lifestyle to reduce recurrence of condition will improve  I certify that inpatient services furnished can reasonably be expected to improve the patient's condition.    Malvin JohnsFARAH,Yazan Gatling, MD 11/28/20199:08 AM

## 2018-05-05 NOTE — Progress Notes (Signed)
Pt observed in the room, seen pacing in room. Pt presented with intense stare, increasing paranoia, and thought-blocking. Pt states "They are out to kill me". Pt refused to complete admission paperwork at this time provided with encouragement. Pt electively mutes to Clinical research associatewriter; however; responded to MHT. Provider on call notified for agitation protocol. Zyprexa 5 mg 1X was ordered and given along with PRN trazodone and vistaril.High falls risk initiated due to unpredictable behavior and unsteady gait.Fluids and snacks offered; Pt declined. Will continue with POC.

## 2018-05-06 MED ORDER — PRENATAL MULTIVITAMIN CH
1.0000 | ORAL_TABLET | Freq: Every day | ORAL | Status: DC
  Administered 2018-05-06 – 2018-05-08 (×3): 1 via ORAL
  Filled 2018-05-06 (×4): qty 1

## 2018-05-06 MED ORDER — CLONAZEPAM 0.5 MG PO TABS
0.2500 mg | ORAL_TABLET | Freq: Three times a day (TID) | ORAL | Status: AC
  Administered 2018-05-06 – 2018-05-07 (×2): 0.25 mg via ORAL
  Filled 2018-05-06 (×2): qty 1

## 2018-05-06 MED ORDER — RISPERIDONE 3 MG PO TABS
3.0000 mg | ORAL_TABLET | Freq: Every day | ORAL | Status: DC
  Administered 2018-05-06 – 2018-05-08 (×3): 3 mg via ORAL
  Filled 2018-05-06 (×4): qty 1

## 2018-05-06 MED ORDER — OMEGA-3-ACID ETHYL ESTERS 1 G PO CAPS
1.0000 g | ORAL_CAPSULE | Freq: Two times a day (BID) | ORAL | Status: DC
  Administered 2018-05-06 – 2018-05-08 (×6): 1 g via ORAL
  Filled 2018-05-06 (×10): qty 1

## 2018-05-06 MED ORDER — RISPERIDONE 2 MG PO TABS
2.0000 mg | ORAL_TABLET | Freq: Every day | ORAL | Status: DC
  Administered 2018-05-07 – 2018-05-08 (×2): 2 mg via ORAL
  Filled 2018-05-06 (×4): qty 1

## 2018-05-06 NOTE — [facility] Counselor (Signed)
Adult Comprehensive Assessment  Patient ID: James Burgess, male   DOB: 02/14/1991, 27 y.o.   MRN: [id number]  Information Source: Information source: Patient  Current Stressors:  Patient states their primary concerns and needs for treatment are:: "I thought I was really sick."  Patient states their goals for this hospitilization and ongoing recovery are:: "Self-esteem and motivation."  Educational / Learning stressors: "No." Employment / Job issues: "Employment is a stressor."  Family Relationships: "No."  Surveyor, quantityinancial / Lack of resources (include bankruptcy): "Yes."  Housing / Lack of housing: "I do have housing."  Physical health (include injuries & life threatening diseases): "Not that I am aware of."  Social relationships: "Right now now."  Substance abuse: "No, I do not do any drug."  Bereavement / Loss: "Loss of loved ones is definitely a stressor, I have not lost anyone recently."   Living/Environment/Situation:  Living Arrangements: Spouse/significant other(Pt lives with girlfriend.) Living conditions (as described by patient or guardian): "It is really clean."  Who else lives in the home?: "It is just me and my girlfriend at home."  How long has patient lived in current situation?: "  Family History:  Marital status: Single What is your sexual orientation?: Heterosexual   Childhood History:     Education:     Employment/Work Situation:      Architectinancial Resources:      Alcohol/Substance Abuse:      Social Support System:      Leisure/Recreation:      Strengths/Needs:      Discharge Plan:      Summary/Recommendations:   Patient is a 27 year old single male in his normal state of health until he got the flu about a week ago he did take Tamiflu and NyQuil and then began suffering from some insomnia, this progressed to the point of hallucinations, delusional statements, and disorganized behavior noted in the emergency department. Pt diagnosed with brief  psychotic disorder.    James Burgess. 05/06/2018   James Burgess, LCSWA, MSW [facility]: Child and Adolescent  [phone]

## 2018-05-06 NOTE — Plan of Care (Signed)
Progress note  D: pt found in bed; allowed to rest. Pt compliant with medication administration. Pt denies any si/hi/ah/vh and verbally agrees to approach staff if these become apparent or [facility]. Pt still seems agitated at times and perhaps still thought blocking. Pt complains of back pain that is generalized which he rates at a 10/10. Pt's family given permission to visit outside of visiting hours. A: pt provided support and encouragement. Pt given medication per protocol and standing orders. Q7521m safety checks implemented and continued. R: pt safe on the unit; will continue to monitor.  Pt progressing in the following metrics  Problem: Activity: Goal: Sleeping patterns will improve Outcome: Progressing   Problem: Coping: Goal: Ability to demonstrate self-control will improve Outcome: Progressing   Problem: Health Behavior/Discharge Planning: Goal: Compliance with treatment plan for underlying cause of condition will improve Outcome: Progressing

## 2018-05-06 NOTE — Progress Notes (Signed)
Writer spoke with patient 1:1 after his girlfriend and parents left. He was very confused and tangential in his conversation with Clinical research associatewriter. He continued to ask if he followed the schedule then he would be able to go home. Writer informed him that the doctor makes this decision. He was informed of his medications and was very hesitant about taking them and he eventually took them. Safety maintained on unit with 15 min checks.

## 2018-05-06 NOTE — Progress Notes (Addendum)
   05/06/18 0500  Sleep  Number of Hours 10.75   Pt has been asleep for majority of shift.

## 2018-05-06 NOTE — [facility] Counselor (Signed)
CSW briefly met with pt to start his psychosocial assessment. Pt presented with agitation however, was able to begin the assessment. Pt appeared delusional as he stated "he is wearing my clothes" (referring to a peer on the unit). As Probation officer asked questions he continued to say "this is an experiment you are doing on me, what are you typing now." Pt also stared off into space as if he were responding to external stimuli. When writer scratched her nose he stated "you are making movements like you are afraid of me, I am not going to hurt you." Writer was unable to complete assessment or obtain consents.   Jahkeem Kurka S. [place], [place], MSW [facility]: Child and Adolescent  [phone]

## 2018-05-06 NOTE — Tx Team (Signed)
Interdisciplinary Treatment and Diagnostic Plan Update  05/06/2018 Time of Session:  James Burgess MRN: [id number]  Principal Diagnosis: <principal problem not specified>  Secondary Diagnoses: Active Problems:   Brief psychotic disorder ([place])   Current Medications:  Current Facility-Administered Medications  Medication Dose Route Frequency Provider Last Rate Last Dose  . acetaminophen (TYLENOL) tablet 650 mg  650 mg Oral Q6H PRN Money, Lowry Ram, FNP   650 mg at 05/06/18 1046  . alum & mag hydroxide-simeth (MAALOX/MYLANTA) 200-200-20 MG/5ML suspension 30 mL  30 mL Oral Q4H PRN Money, Lowry Ram, FNP      . clonazePAM (KLONOPIN) tablet 0.5 mg  0.5 mg Oral TID Johnn Hai, MD   0.5 mg at 05/06/18 1045  . hydrOXYzine (ATARAX/VISTARIL) tablet 25 mg  25 mg Oral TID PRN Money, Lowry Ram, FNP   25 mg at 05/04/18 2236  . magnesium hydroxide (MILK OF MAGNESIA) suspension 30 mL  30 mL Oral Daily PRN Money, Lowry Ram, FNP      . omega-3 acid ethyl esters (LOVAZA) capsule 1 g  1 g Oral BID Johnn Hai, MD   1 g at 05/06/18 1045  . prenatal multivitamin tablet 1 tablet  1 tablet Oral Q1200 Johnn Hai, MD   1 tablet at 05/06/18 1044  . [START ON 05/07/2018] risperiDONE (RISPERDAL) tablet 2 mg  2 mg Oral Daily Johnn Hai, MD      . risperiDONE (RISPERDAL) tablet 3 mg  3 mg Oral QHS Johnn Hai, MD      . temazepam (RESTORIL) capsule 30 mg  30 mg Oral QHS Johnn Hai, MD      . traZODone (DESYREL) tablet 50 mg  50 mg Oral QHS PRN Money, Lowry Ram, FNP   50 mg at 05/04/18 2235   PTA Medications: Medications Prior to Admission  Medication Sig Dispense Refill Last Dose  . amoxicillin-clavulanate (AUGMENTIN) 875-125 MG tablet    Completed Course at Unknown time  . benzonatate (TESSALON) 100 MG capsule    Completed Course at Unknown time  . ipratropium (ATROVENT) 0.06 % nasal spray    Completed Course at Unknown time  . Omega-3 Fatty Acids (FISH OIL) 1000 MG CAPS Take 1,000 mg by mouth daily.   Past  Month at Unknown time  . promethazine-dextromethorphan (PROMETHAZINE-DM) 6.25-15 MG/5ML syrup    Completed Course at Unknown time  . zolpidem (AMBIEN) 5 MG tablet Take 1 tablet (5 mg total) by mouth at bedtime as needed for sleep. 10 tablet 0 has not filled    Patient Stressors: Health problems  Patient Strengths: Average or above average intelligence Capable of independent living Supportive family/friends  Treatment Modalities: Medication Management, Group therapy, Case management,  1 to 1 session with clinician, Psychoeducation, Recreational therapy.   Physician Treatment Plan for Primary Diagnosis: <principal problem not specified> Long Term Goal(s): Improvement in symptoms so as ready for discharge Improvement in symptoms so as ready for discharge   Short Term Goals: Ability to identify changes in lifestyle to reduce recurrence of condition will improve Ability to identify changes in lifestyle to reduce recurrence of condition will improve  Medication Management: Evaluate patient's response, side effects, and tolerance of medication regimen.  Therapeutic Interventions: 1 to 1 sessions, Unit Group sessions and Medication administration.  Evaluation of Outcomes: Not Met  Physician Treatment Plan for Secondary Diagnosis: Active Problems:   Brief psychotic disorder [facility]  Long Term Goal(s): Improvement in symptoms so as ready for discharge Improvement in symptoms so as ready for discharge  Short Term Goals: Ability to identify changes in lifestyle to reduce recurrence of condition will improve Ability to identify changes in lifestyle to reduce recurrence of condition will improve     Medication Management: Evaluate patient's response, side effects, and tolerance of medication regimen.  Therapeutic Interventions: 1 to 1 sessions, Unit Group sessions and Medication administration.  Evaluation of Outcomes: Not Met   RN Treatment Plan for Primary Diagnosis: <principal problem  not specified> Long Term Goal(s): Knowledge of disease and therapeutic regimen to maintain health will improve  Short Term Goals: Ability to participate in decision making will improve, Ability to verbalize feelings will improve, Ability to disclose and discuss suicidal ideas and Ability to identify and develop effective coping behaviors will improve  Medication Management: RN will administer medications as ordered by provider, will assess and evaluate patient's response and provide education to patient for prescribed medication. RN will report any adverse and/or side effects to prescribing provider.  Therapeutic Interventions: 1 on 1 counseling sessions, Psychoeducation, Medication administration, Evaluate responses to treatment, Monitor vital signs and CBGs as ordered, Perform/monitor CIWA, COWS, AIMS and Fall Risk screenings as ordered, Perform wound care treatments as ordered.  Evaluation of Outcomes: Not Met   LCSW Treatment Plan for Primary Diagnosis: <principal problem not specified> Long Term Goal(s): Safe transition to appropriate next level of care at discharge, Engage patient in therapeutic group addressing interpersonal concerns.  Short Term Goals: Engage patient in aftercare planning with referrals and resources  Therapeutic Interventions: Assess for all discharge needs, 1 to 1 time with Social worker, Explore available resources and support systems, Assess for adequacy in community support network, Educate family and significant other(s) on suicide prevention, Complete Psychosocial Assessment, Interpersonal group therapy.  Evaluation of Outcomes: Not Met   Progress in Treatment: Attending groups: No. Participating in groups: No. Taking medication as prescribed: Yes. Toleration medication: Yes. Family/Significant other contact made: No, will contact:  if patient consents to collateral contacts Patient understands diagnosis: Yes. Discussing patient identified problems/goals  with staff: Yes. Medical problems stabilized or resolved: Yes. Denies suicidal/homicidal ideation: Yes. Issues/concerns per patient self-inventory: No. Other:   New problem(s) identified: None  New Short Term/Long Term Goal(s): medication stabilization, elimination of SI thoughts, development of comprehensive mental wellness plan.   Patient Goals:  To stop hearing things.   Discharge Plan or Barriers: CSW will assess for appropriate referrals and possible discharge planning.   Reason for Continuation of Hospitalization: Anxiety Depression Hallucinations Medication stabilization Suicidal ideation  Estimated Length of Stay: 3-5 days   Attendees: Patient: 05/06/2018 11:16 AM  Physician: Dr. Johnn Hai  05/06/2018 11:16 AM  Nursing: Legrand Como. Chauncey Cruel, RN 05/06/2018 11:16 AM  RN Care Manager: 05/06/2018 11:16 AM  Social Worker: Radonna Ricker, [place] 05/06/2018 11:16 AM  Recreational Therapist:  05/06/2018 11:16 AM  Other:  05/06/2018 11:16 AM  Other:  05/06/2018 11:16 AM  Other: 05/06/2018 11:16 AM    Scribe for Treatment Team: Marylee Floras, [place] 05/06/2018 11:16 AM

## 2018-05-06 NOTE — Progress Notes (Addendum)
[facility]  05/06/2018 9:15 AM James RoanMatthew Burgess  MRN:  [id number] Subjective:    This is hospital day number 2 for James Burgess, he did sleep pretty well last night of course this was by design given the Restoril he was given Nursing notes indicate that last evening he still was anxious, delusional although the delusions were ill-defined, and seemed to be responding to stimuli though he denied hallucinations and further had significant thought blocking.  This is the general pattern on rounds today he stares ahead makes a light contact and pauses for long periods of time before he does answer but for the most part has thought blocking and does not answer most of my questions. I cannot discern any delusional believes that when he does answer but he is not fully oriented, not aware of date date or time, knows he is hospitalized but cannot articulate much more than that remains highly anxious.   Principal Problem: New onset psychosis Diagnosis: Active Problems:   Brief psychotic disorder ([facility])  Total Time spent with patient: 20 minutes  Past Psychiatric History: NO NEW DATA  Past Medical History: History reviewed. No pertinent past medical history. History reviewed. No pertinent surgical history. Family History: History reviewed. No pertinent family history. Family Psychiatric  History: mother and grandmother have BPAD Social History:  Social History   Substance and Sexual Activity  Alcohol Use Yes  . Frequency: Never   Comment: occassional glass of wine     Social History   Substance and Sexual Activity  Drug Use Never    Social History   Socioeconomic History  . Marital status: Single    Spouse name: Not on file  . Number of children: Not on file  . Years of education: Not on file  . Highest education level: Not on file  Occupational History  . Not on file  Social Needs  . Financial resource strain: Not on file  . Food insecurity:    Worry: Not on file   Inability: Not on file  . Transportation needs:    Medical: Not on file    Non-medical: Not on file  Tobacco Use  . Smoking status: Never Smoker  . Smokeless tobacco: Never Used  Substance and Sexual Activity  . Alcohol use: Yes    Frequency: Never    Comment: occassional glass of wine  . Drug use: Never  . Sexual activity: Yes    Birth control/protection: None  Lifestyle  . Physical activity:    Days per week: Not on file    Minutes per session: Not on file  . Stress: Not on file  Relationships  . Social connections:    Talks on phone: Not on file    Gets together: Not on file    Attends religious service: Not on file    Active member of club or organization: Not on file    Attends meetings of clubs or organizations: Not on file    Relationship status: Not on file  Other Topics Concern  . Not on file  Social History Narrative  . Not on file   Additional Social History:                         Sleep: Good  Appetite:  Poor  Current Medications: Current Facility-Administered Medications  Medication Dose Route Frequency Provider Last Rate Last Dose  . acetaminophen (TYLENOL) tablet 650 mg  650 mg Oral Q6H PRN Money, Gerlene Burdockravis B, FNP      .  alum & mag hydroxide-simeth (MAALOX/MYLANTA) 200-200-20 MG/5ML suspension 30 mL  30 mL Oral Q4H PRN Money, Gerlene Burdock, FNP      . clonazePAM (KLONOPIN) tablet 0.5 mg  0.5 mg Oral TID Malvin Johns, MD   0.5 mg at 05/05/18 1720  . hydrOXYzine (ATARAX/VISTARIL) tablet 25 mg  25 mg Oral TID PRN Money, Gerlene Burdock, FNP   25 mg at 05/04/18 2236  . magnesium hydroxide (MILK OF MAGNESIA) suspension 30 mL  30 mL Oral Daily PRN Money, Gerlene Burdock, FNP      . omega-3 acid ethyl esters (LOVAZA) capsule 1 g  1 g Oral BID Malvin Johns, MD      . prenatal multivitamin tablet 1 tablet  1 tablet Oral Q1200 Malvin Johns, MD      . Melene Muller ON 05/07/2018] risperiDONE (RISPERDAL) tablet 2 mg  2 mg Oral Daily Malvin Johns, MD      . risperiDONE (RISPERDAL)  tablet 3 mg  3 mg Oral QHS Malvin Johns, MD      . temazepam (RESTORIL) capsule 30 mg  30 mg Oral QHS Malvin Johns, MD      . traZODone (DESYREL) tablet 50 mg  50 mg Oral QHS PRN Money, Gerlene Burdock, FNP   50 mg at 05/04/18 2235    Lab Results: No results found for this or any previous visit (from the past 48 hour(s)).  Blood Alcohol level:  Lab Results  Component Value Date   ETH <10 05/03/2018   ETH <10 05/02/2018    Metabolic Disorder Labs: No results found for: HGBA1C, MPG No results found for: PROLACTIN No results found for: CHOL, TRIG, HDL, CHOLHDL, VLDL, LDLCALC  Physical Findings: AIMS: Facial and Oral Movements Muscles of Facial Expression: None, normal Lips and Perioral Area: None, normal Jaw: None, normal Tongue: None, normal,Extremity Movements Upper (arms, wrists, hands, fingers): None, normal Lower (legs, knees, ankles, toes): None, normal, Trunk Movements Neck, shoulders, hips: None, normal, Overall Severity Severity of abnormal movements (highest score from questions above): None, normal Incapacitation due to abnormal movements: None, normal Patient's awareness of abnormal movements (rate only patient's report): No Awareness, Dental Status Current problems with teeth and/or dentures?: No Does patient usually wear dentures?: No  CIWA:    COWS:     Musculoskeletal: Strength & Muscle Tone: within normal limits Gait & Station: normal Patient leans: N/A  Psychiatric Specialty Exam: Physical Exam  ROS  Blood pressure 135/90, pulse 85, temperature 98.8 F (37.1 C), temperature source Oral, resp. rate 20, height 6\' 2"  (1.88 m), weight 83 kg, SpO2 100 %.Body mass index is 23.5 kg/m.  General Appearance: Casual  Eye Contact:  Poor  Speech:  Blocked  Volume:  Decreased  Mood:  Anxious and Depressed  Affect:  Congruent  Thought Process:  Disorganized  Orientation:  Other:  Blocking noted  Thought Content:  Paranoid Ideation  Suicidal Thoughts:  No  Homicidal  Thoughts:  No  Memory:  Immediate;   Poor  Judgement:  Impaired  Insight:  Shallow  Psychomotor Activity:  Psychomotor Retardation  Concentration:  Concentration: Fair  Recall:  Fair  Fund of Knowledge:  Fair  Language:  Poor  Akathisia:  Negative  Handed:  Right  AIMS (if indicated):     Assets:  Communication Skills  ADL's:  Intact  Cognition:  Impaired,  Mild  Sleep:  Number of Hours: 10.75   In summary, we had hoped would be a brief reactive psychotic episode due to 5 days of insomnia  seems to be a schizophreniform presentation.  We will escalate Risperdal by 1 mg at bedtime, add neuro protective measures such as omega-3's and B vitamins, discussed with family with his permission  Continue current precautions patient did understand what it means to notify staff if he has thoughts of harming himself   Treatment Plan Summary: Daily contact with patient to assess and evaluate symptoms and progress in treatment  Topanga Alvelo, MD 05/06/2018, 9:15 AM

## 2018-05-07 NOTE — Progress Notes (Addendum)
[facility]  05/07/2018 3:04 PM James Burgess  MRN:  [id number]   Evaluation: James HazardMatthew observed pacing the unit.  Patient continues to present with thought blocking, paranoia and depression.  Patient had a schedule a [facility] visit as [facility] [place].  Patient became tearful during this assessment due to missing his family and reported " I am not my self".  Patient reports "I just need sleep and sleep with make it better'.  Denies suicidal or homicidal ideations.  Denies auditory or visual hallucinations.  Patient observed with intense staring and appears to have difficulty processing during this assessment.  Reports a good appetite.  States he is resting well throughout the night.  Patient reports feelings of overwhelming sadness as he reports he does not feel his employer cares. "  Nobody came to see me face-to-face".  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  Denies illicit drug use.  James HazardMatthew reports taking and tolerating medications well.  Denies headaches nausea vomiting or diarrhea.  Denies auditory or visual hallucinations with much hesitancy.  Reports a good appetite.  States he is resting well throughout the night.  Support encouragement reassurance was provided.  History: Per assessment note James Burgess is  27 year old single patient with his normal state of health until he got the flu about a week ago he did take Tamiflu and NyQuil and then began suffering from some insomnia, this progressed to the point of hallucinations, delusional statements, and disorganized behavior noted in the emergency department. Patient acknowledges stress he is unable to give me much valid history. He did express several delusions that while hospitalized in the ER, and he is now sleeping and difficult to arouse.He did receive trazodone and Zyprexa But these were fairly low doses. When I arouse him he can give me mumbling incoherent answers and falls back  asleep   Principal Problem: New onset psychosis Diagnosis: Active Problems:   Brief psychotic disorder ([facility])  Total Time spent with patient: 20 minutes  Past Psychiatric History: NO NEW DATA  Past Medical History: History reviewed. No pertinent past medical history. History reviewed. No pertinent surgical history. Family History: History reviewed. No pertinent family history. Family Psychiatric  History: mother and grandmother have BPAD Social History:  Social History   Substance and Sexual Activity  Alcohol Use Yes  . Frequency: Never   Comment: occassional glass of wine     Social History   Substance and Sexual Activity  Drug Use Never    Social History   Socioeconomic History  . Marital status: Single    Spouse name: Not on file  . Number of children: Not on file  . Years of education: Not on file  . Highest education level: Not on file  Occupational History  . Not on file  Social Needs  . Financial resource strain: Not on file  . Food insecurity:    Worry: Not on file    Inability: Not on file  . Transportation needs:    Medical: Not on file    Non-medical: Not on file  Tobacco Use  . Smoking status: Never Smoker  . Smokeless tobacco: Never Used  Substance and Sexual Activity  . Alcohol use: Yes    Frequency: Never    Comment: occassional glass of wine  . Drug use: Never  . Sexual activity: Yes    Birth control/protection: None  Lifestyle  . Physical activity:    Days per week: Not on  file    Minutes per session: Not on file  . Stress: Not on file  Relationships  . Social connections:    Talks on phone: Not on file    Gets together: Not on file    Attends religious service: Not on file    Active member of club or organization: Not on file    Attends meetings of clubs or organizations: Not on file    Relationship status: Not on file  Other Topics Concern  . Not on file  Social History Narrative  . Not on file   Additional Social History:                          Sleep: Good  Appetite:  Poor  Current Medications: Current Facility-Administered Medications  Medication Dose Route Frequency Provider Last Rate Last Dose  . acetaminophen (TYLENOL) tablet 650 mg  650 mg Oral Q6H PRN Money, Gerlene Burdock, FNP   650 mg at 05/06/18 1046  . alum & mag hydroxide-simeth (MAALOX/MYLANTA) 200-200-20 MG/5ML suspension 30 mL  30 mL Oral Q4H PRN Money, Gerlene Burdock, FNP      . hydrOXYzine (ATARAX/VISTARIL) tablet 25 mg  25 mg Oral TID PRN Money, Gerlene Burdock, FNP   25 mg at 05/04/18 2236  . magnesium hydroxide (MILK OF MAGNESIA) suspension 30 mL  30 mL Oral Daily PRN Money, Gerlene Burdock, FNP      . omega-3 acid ethyl esters (LOVAZA) capsule 1 g  1 g Oral BID Malvin Johns, MD   1 g at 05/07/18 0802  . prenatal multivitamin tablet 1 tablet  1 tablet Oral Q1200 Malvin Johns, MD   1 tablet at 05/07/18 1200  . risperiDONE (RISPERDAL) tablet 2 mg  2 mg Oral Daily Malvin Johns, MD   2 mg at 05/07/18 0802  . risperiDONE (RISPERDAL) tablet 3 mg  3 mg Oral QHS Malvin Johns, MD   3 mg at 05/06/18 2148  . temazepam (RESTORIL) capsule 30 mg  30 mg Oral QHS Malvin Johns, MD   30 mg at 05/06/18 2148  . traZODone (DESYREL) tablet 50 mg  50 mg Oral QHS PRN Money, Gerlene Burdock, FNP   50 mg at 05/04/18 2235    Lab Results: No results found for this or any previous visit (from the past 48 hour(s)).  Blood Alcohol level:  Lab Results  Component Value Date   ETH <10 05/03/2018   ETH <10 05/02/2018    Metabolic Disorder Labs: No results found for: HGBA1C, MPG No results found for: PROLACTIN No results found for: CHOL, TRIG, HDL, CHOLHDL, VLDL, LDLCALC  Physical Findings: AIMS: Facial and Oral Movements Muscles of Facial Expression: None, normal Lips and Perioral Area: None, normal Jaw: None, normal Tongue: None, normal,Extremity Movements Upper (arms, wrists, hands, fingers): None, normal Lower (legs, knees, ankles, toes): None, normal, Trunk Movements Neck,  shoulders, hips: None, normal, Overall Severity Severity of abnormal movements (highest score from questions above): None, normal Incapacitation due to abnormal movements: None, normal Patient's awareness of abnormal movements (rate only patient's report): No Awareness, Dental Status Current problems with teeth and/or dentures?: No Does patient usually wear dentures?: No  CIWA:    COWS:     Musculoskeletal: Strength & Muscle Tone: within normal limits Gait & Station: normal Patient leans: N/A  Psychiatric Specialty Exam: Physical Exam  Nursing note and vitals reviewed. Constitutional: He appears well-developed.    Review of Systems  Psychiatric/Behavioral: Positive for depression. Negative  for suicidal ideas. The patient is nervous/anxious and has insomnia.   All other systems reviewed and are negative.   Blood pressure 119/77, pulse (!) 149, temperature 98 F (36.7 C), temperature source Oral, resp. rate 18, height 6\' 2"  (1.88 m), weight 83 kg, SpO2 100 %.Body mass index is 23.5 kg/m.  General Appearance: Casual  Eye Contact:  Poor  Speech:  Blocked  Volume:  Decreased  Mood:  Anxious and Depressed  Affect:  Congruent  Thought Process:  Disorganized  Orientation:  Other:  Blocking noted  Thought Content:  Paranoid Ideation  Suicidal Thoughts:  No  Homicidal Thoughts:  No  Memory:  Immediate;   Poor  Judgement:  Impaired  Insight:  Shallow  Psychomotor Activity:  Psychomotor Retardation  Concentration:  Concentration: Fair  Recall:  Fair  Fund of Knowledge:  Fair  Language:  Poor  Akathisia:  Negative  Handed:  Right  AIMS (if indicated):     Assets:  Communication Skills  ADL's:  Intact  Cognition:  Impaired,  Mild  Sleep:  Number of Hours: 6.75      Treatment Plan Summary: Daily contact with patient to assess and evaluate symptoms and progress in treatment  Continue with current treatment plan on 05/07/2018 as listed below except were noted  Brief  psychotic disorder:   Continue Resperidal all 2 mg p.o. Daily + Resperidal 3 mg p.o. Nightly  Continue multivitamin 1 mg p.o. Daily  Continue omega-3 1 g twice daily  Continue Klonopin 0.25 mg   Insomnia:   Continue trazodone 50 mg p.o. nightly as needed  Continue Restoril 30 mg p.o. Nightly    CSW to continue working on discharge disposition Patient encouraged to participate throughout the milieu   Oneta Rack, NP 05/07/2018, 3:04 PM  ..Agree with NP Progress Note

## 2018-05-07 NOTE — [facility] Group Notes (Signed)
LCSW Group Therapy Note  05/07/2018    11:15am-12:00pm   Type of Therapy and Topic:  Group Therapy: Messages Received About Mental Illness  Participation Level:  Active   Description of Group:   In this group, patients shared and discussed the messages received in their lives about the issue of mental illness through parental or other family members' conversations, examples, teaching, repression, punishment, access to care, and more.  Participants identified how those childhood lessons influence even now their own feelings about seeking and/or accepting treatment for their mental health issues.  We discussed the costs and benefits of allowing ourselves to accept a diagnosis of a mental illness, as well as ongoing treatment.   Therapeutic Goals: 1. Patients will identify at least one message about mental illness that they have received and how it was taught to them. 2. Patients will discuss how these experiences have influenced and continue to influence their own acceptance of a mental health diagnosis and/or treatment even today. 3. Patients will explore possible primary costs and benefits of accepting a diagnosis and/or treatment. 4. Patients will learn that they have much in common with other group members and will receive encouragement from group leader and each other to be their true selves, even with flaws.  Summary of Patient Progress:  The patient did not respond directly to the question about what lessons he learned about mental illness early on in his life, but rather talked about the grief issues he is facing and how confused and sad he is.  We discussed a breathing technique and grounding exercise he can use individually.  He stated he would benefit greatly from having "sessions like these set up" so he could continue to work on these issues outside the hospital setting.  He was tearful for much of group.  Therapeutic Modalities:   Cognitive Behavioral Therapy Motivation  Interviewing  [name]  .

## 2018-05-07 NOTE — Progress Notes (Signed)
Writer has observed patient up in the dayroom visiting with his mother and girlfriend. Mother reports that she has extended visitation and will call tomorrow to see when its a good time for her to visit during the day. Patient continues to be confused, thought-blocking and paranoid. He tries really hard to follow the schedule and will look at the schedule on the wall several times. Safety maintained on unit with 15 min checks.
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
## 2018-05-07 NOTE — Plan of Care (Signed)
  Problem: Safety: Goal: Periods of time without injury will increase Outcome: Progressing  DAR NOTE: Patient presents with anxious affect and mood.  Denies suicidal thoughts, pain, auditory and visual hallucinations.  Patient still presents with thought-blocking.  Fixated on unit schedule and discharging.  Described energy level as low and concentration as poor.  Rates depression at 7, hopelessness at 6, and anxiety at 6.  Maintained on routine safety checks.  Medications given as prescribed.  Support and encouragement offered as needed.  States goal for today is "to get better so I can go back to work."  Patient remained withdrawn and isolates to his room for most of this shift.  Patient is safe on and off the unit.

## 2018-05-07 NOTE — [facility] Counselor (Signed)
Clinical Social Work Note  At parents' request, met with them briefly to go over expectations of treatment.  They shared that they came here from [place] and helped to get pt checked into hospital.  Because of their past experiences with family members in mental health treatment, they do not feel this is the best environment for him to get well.  When he is discharged, mother is going to stay at least one week and possibly longer, depending on the need, in order to help him continue to get well.  They asked if he could therefore be discharged into their care.  CSW explained to them that he has to be well enough to identify that as his discharge plan on his own, that he cannot simply be discharged because noncustodial parents are willing to help him.  We talked about setting him up with aftercare planning and they are very much in favor of this.    Parents are Gerald Stabs and Margarita Rana and they can be reached at [phone].  Selmer Dominion, LCSW 05/07/2018, 1:39 PM

## 2018-05-08 ENCOUNTER — Encounter (HOSPITAL_COMMUNITY): Payer: Self-pay

## 2018-05-08 LAB — COMPREHENSIVE METABOLIC PANEL
ALT: 129 U/L — ABNORMAL HIGH (ref 0–44)
AST: 43 U/L — ABNORMAL HIGH (ref 15–41)
Albumin: 4 g/dL (ref 3.5–5.0)
Alkaline Phosphatase: 72 U/L (ref 38–126)
Anion gap: 8 (ref 5–15)
BUN: 21 mg/dL — ABNORMAL HIGH (ref 6–20)
CO2: 26 mmol/L (ref 22–32)
Calcium: 8.5 mg/dL — ABNORMAL LOW (ref 8.9–10.3)
Chloride: 104 mmol/L (ref 98–111)
Creatinine, Ser: 1.02 mg/dL (ref 0.61–1.24)
GFR calc Af Amer: >60 mL/min (ref >60)
GFR calc non Af Amer: >60 mL/min (ref >60)
Glucose, Bld: 95 mg/dL (ref 70–99)
Potassium: 3.8 mmol/L (ref 3.5–5.1)
Sodium: 138 mmol/L (ref 135–145)
Total Bilirubin: 1.1 mg/dL (ref 0.3–1.2)
Total Protein: 6.7 g/dL (ref 6.5–8.1)

## 2018-05-08 LAB — CBC WITH DIFFERENTIAL/PLATELET
Abs Immature Granulocytes: 0.03 10*3/uL (ref 0.00–0.07)
Basophils Absolute: 0 10*3/uL (ref 0.0–0.1)
Basophils Relative: 0 %
Eosinophils Absolute: 0.1 10*3/uL (ref 0.0–0.5)
Eosinophils Relative: 1 %
HCT: 43.7 % (ref 39.0–52.0)
Hemoglobin: 14.8 g/dL (ref 13.0–17.0)
Immature Granulocytes: 0 %
LYMPHS ABS: 1.4 10*3/uL (ref 0.7–4.0)
Lymphocytes Relative: 18 %
MCH: 28.7 pg (ref 26.0–34.0)
MCHC: 33.9 g/dL (ref 30.0–36.0)
MCV: 84.7 fL (ref 80.0–100.0)
Monocytes Absolute: 0.6 10*3/uL (ref 0.1–1.0)
Monocytes Relative: 8 %
NRBC: 0 % (ref 0.0–0.2)
Neutro Abs: 5.7 10*3/uL (ref 1.7–7.7)
Neutrophils Relative %: 73 %
Platelets: 277 10*3/uL (ref 150–400)
RBC: 5.16 MIL/uL (ref 4.22–5.81)
RDW: 11.9 % (ref 11.5–15.5)
WBC: 7.8 10*3/uL (ref 4.0–10.5)

## 2018-05-08 MED ORDER — LACTATED RINGERS IV BOLUS
1000.0000 mL | Freq: Once | INTRAVENOUS | Status: DC
  Filled 2018-05-08: qty 1000

## 2018-05-08 MED ORDER — LOPERAMIDE HCL 2 MG PO CAPS
2.0000 mg | ORAL_CAPSULE | ORAL | Status: DC | PRN
  Administered 2018-05-08: 2 mg via ORAL
  Filled 2018-05-08: qty 1

## 2018-05-08 NOTE — ED Triage Notes (Addendum)
Patient sent over from behavioral health. RN (Johnathon) at behavioral health states patient needs fluids. Patient states he has been having diarrhea for a couple days.  Behavioral health tech with patient. Tech states he does not know anything else besides the patient needing fluids.  Patient paranoid in triage.  Patient denies any pain.

## 2018-05-08 NOTE — ED Provider Notes (Signed)
[facility]-EMERGENCY DEPT Provider Note   CSN: [id number] Arrival date & time: 05/08/18  1124     History   Chief Complaint Chief Complaint  Patient presents with  . Diarrhea    HPI James Burgess is a 27 y.o. male.  The history is provided by the patient.  Diarrhea   This is a new problem. The current episode started yesterday. The problem occurs 2 to 4 times per day. The problem has not changed since onset.The stool consistency is described as watery. There has been no fever. The fever has been present for less than 1 day. Pertinent negatives include no abdominal pain, no vomiting, no chills, no sweats, no headaches, no arthralgias, no myalgias, no URI and no cough. He has tried nothing for the symptoms. The treatment provided no relief.    History reviewed. No pertinent past medical history.  Patient Active Problem List   Diagnosis Date Noted  . Brief psychotic disorder (HCC) 05/04/2018    History reviewed. No pertinent surgical history.      Home Medications    Prior to Admission medications   Medication Sig Start Date End Date Taking? Authorizing Provider  amoxicillin-clavulanate (AUGMENTIN) 875-125 MG tablet  04/29/18   [provider]  benzonatate (TESSALON) 100 MG capsule  05/02/18   [provider]  ipratropium (ATROVENT) 0.06 % nasal spray  05/02/18   [provider]  Omega-3 Fatty Acids (FISH OIL) 1000 MG CAPS Take 1,000 mg by mouth daily.    [provider]  promethazine-dextromethorphan (PROMETHAZINE-DM) 6.25-15 MG/5ML syrup  04/29/18   [provider]  zolpidem (AMBIEN) 5 MG tablet Take 1 tablet (5 mg total) by mouth at bedtime as needed for sleep. 05/02/18   Lorre Nick, MD    Family History History reviewed. No pertinent family history.  Social History Social History   Tobacco Use  . Smoking status: Never Smoker  . Smokeless tobacco: Never Used  Substance Use Topics  .  Alcohol use: Yes    Frequency: Never    Comment: occassional glass of wine  . Drug use: Never     Allergies   Patient has no known allergies.   Review of Systems Review of Systems  Constitutional: Negative for chills and fever.  HENT: Negative for ear pain and sore throat.   Eyes: Negative for pain and visual disturbance.  Respiratory: Negative for cough and shortness of breath.   Cardiovascular: Negative for chest pain and palpitations.  Gastrointestinal: Positive for diarrhea. Negative for abdominal pain and vomiting.  Genitourinary: Negative for dysuria and hematuria.  Musculoskeletal: Negative for arthralgias, back pain and myalgias.  Skin: Negative for color change and rash.  Neurological: Negative for seizures, syncope and headaches.  All other systems reviewed and are negative.    Physical Exam Updated Vital Signs  ED Triage Vitals [05/04/18 1402]  Enc Vitals Group     BP 135/90     Pulse Rate 85     Resp 20     Temp 98.8 F (37.1 C)     Temp Source Oral     SpO2 100 %     Weight 183 lb (83 kg)     Height 6\' 2"  (1.88 m)     Head Circumference      Peak Flow      Pain Score 0     Pain Loc      Pain Edu?      Excl. in GC?  Physical Exam  Constitutional: He is oriented to person, place, and time. He appears well-developed and well-nourished.  HENT:  Head: Normocephalic and atraumatic.  Eyes: Pupils are equal, round, and reactive to light. Conjunctivae and EOM are normal.  Neck: Normal range of motion. Neck supple.  Cardiovascular: Normal rate, regular rhythm, normal heart sounds and intact distal pulses.  No murmur heard. Pulmonary/Chest: Effort normal and breath sounds normal. No respiratory distress.  Abdominal: Soft. He exhibits no distension. There is no tenderness.  Musculoskeletal: Normal range of motion. He exhibits no edema.  Neurological: He is alert and oriented to person, place, and time.  Skin: Skin is warm and dry. Capillary refill  takes less than 2 seconds.  Psychiatric: He has a normal mood and affect.  Nursing note and vitals reviewed.    ED Treatments / Results  Labs (all labs ordered are listed, but only abnormal results are displayed) Labs Reviewed  COMPREHENSIVE METABOLIC PANEL - Abnormal; Notable for the following components:      Result Value   BUN 21 (*)    Calcium 8.5 (*)    AST 43 (*)    ALT 129 (*)    All other components within normal limits  CBC WITH DIFFERENTIAL/PLATELET    EKG None  Radiology No results found.  Procedures Procedures (including critical care time)  Medications Ordered in ED Medications  acetaminophen (TYLENOL) tablet 650 mg (650 mg Oral Given 05/06/18 1046)  alum & mag hydroxide-simeth (MAALOX/MYLANTA) 200-200-20 MG/5ML suspension 30 mL (has no administration in time range)  hydrOXYzine (ATARAX/VISTARIL) tablet 25 mg (25 mg Oral Given 05/04/18 2236)  magnesium hydroxide (MILK OF MAGNESIA) suspension 30 mL (has no administration in time range)  traZODone (DESYREL) tablet 50 mg (50 mg Oral Given 05/04/18 2235)  temazepam (RESTORIL) capsule 30 mg (30 mg Oral Given 05/07/18 2026)  risperiDONE (RISPERDAL) tablet 2 mg (2 mg Oral Given 05/08/18 0803)  risperiDONE (RISPERDAL) tablet 3 mg (3 mg Oral Given 05/07/18 2026)  prenatal multivitamin tablet 1 tablet (1 tablet Oral Given 05/07/18 1200)  omega-3 acid ethyl esters (LOVAZA) capsule 1 g (1 g Oral Given 05/08/18 0803)  loperamide (IMODIUM) capsule 2 mg (2 mg Oral Given 05/08/18 1020)  lactated ringers bolus 1,000 mL (0 mLs Intravenous Stopped 05/08/18 1215)  OLANZapine zydis (ZYPREXA) disintegrating tablet 5 mg (5 mg Oral Given 05/04/18 2235)  clonazePAM (KLONOPIN) tablet 0.25 mg (0.25 mg Oral Given 05/07/18 0802)     Initial Impression / Assessment and Plan / ED Course  I have reviewed the triage vital signs and the nursing notes.  Pertinent labs & imaging results that were available during my care of the patient were  reviewed by me and considered in my medical decision making (see chart for details).     [name] is a 27 year old male with history of psychiatric illness who presents to the ED from behavioral health with concern for dehydration.  Patient with normal vitals.  No fever.  Patient has had diarrhea during his inpatient stay and a viral health sent patient for evaluation for dehydration.  Patient clinically appears well.  Normal vitals.  No fever.  Patient with no abdominal tenderness on exam.  Denies any melena, hematochezia, hematemesis.  Lab work showed no signs of dehydration.  Normal kidney function.  No significant leukocytosis, electrolyte abnormality.  Patient was given IV fluid bolus.  Repeat abdominal exam was unremarkable.  Patient felt better following IV fluids and was discharged back to behavioral health in stable condition.  Likely viral process.  This chart was dictated using voice recognition software.  Despite best efforts to proofread,  errors can occur which can change the documentation meaning.  Final Clinical Impressions(s) / ED Diagnoses   Final diagnoses:  Diarrhea, unspecified type    ED Discharge Orders    None       Virgina NorfolkCuratolo, Zuhayr Deeney, DO 05/08/18 1533

## 2018-05-08 NOTE — Progress Notes (Signed)
Patient's parents (mother and father, christine and Sammuel Cooperlarry Wollard) are requesting to meet with the social worker and provider tomorrow discussing their son's care. They are also concerned about his bills which are due today 05/08/18, including his mortgage payment. Best number to contact at is [phone].

## 2018-05-08 NOTE — ED Notes (Signed)
Bed: [id number] Expected date:  Expected time:  Means of arrival:  [facility]

## 2018-05-08 NOTE — [facility] Group Notes (Signed)
[facility] LCSW Group Therapy Note  Date/Time:  05/08/2018  11:00AM-12:00PM  Type of Therapy and Topic:  Group Therapy:  Music and Mood  Participation Level:  Minimal   Description of Group: In this process group, members listened to a variety of genres of music and identified that different types of music evoke different responses.  Patients were encouraged to identify music that was soothing for them and music that was energizing for them.  Patients discussed how this knowledge can help with wellness and recovery in various ways including managing depression and anxiety as well as encouraging healthy sleep habits.    Therapeutic Goals: 1. Patients will explore the impact of different varieties of music on mood 2. Patients will verbalize the thoughts they have when listening to different types of music 3. Patients will identify music that is soothing to them as well as music that is energizing to them 4. Patients will discuss how to use this knowledge to assist in maintaining wellness and recovery 5. Patients will explore the use of music as a coping skill  Summary of Patient Progress:  At the beginning of group, patient expressed that he felt "very tired" and said he cannot get any sleep.  He held his head in his hands for a few minutes through the first song, then was called out of the room by his nurse and did not return.  Therapeutic Modalities: Solution Focused Brief Therapy Activity   James MantleMareida Grossman-Orr, LCSW

## 2018-05-08 NOTE — Progress Notes (Addendum)
[facility]  05/08/2018 12:29 PM James Burgess  MRN:  [id number]   Evaluation: James Burgess observed resting in bed.  He is awake alert and oriented to person and place.  Patient reports " I do not know how to feel, one moment I have energy in the next time unable to stay awake" patient continues to present with thought blocking and disorganized thoughts.  Staff reported concerns due to elevated pulse rate.  Orders placed for patient to be evaluated further emergency department for rehydration.  patient reports feeling dehydrated, patient was encouraged to increase hydration.  Not reports taking medications and tolerating them well.  Reported concerns of diarrhea and fatigue.  Chart reviewed.  We will follow-up with patient after medical clearance.  Continues to deny suicidal or homicidal ideations.  Patient reports he feels ready to go home as he misses his family.  Reported decreased appetite.  support encouragement reassurance was provided.   History: Per assessment note James Burgess is  27 year old single patient with his normal state of health until he got the flu about a week ago he did take Tamiflu and NyQuil and then began suffering from some insomnia, this progressed to the point of hallucinations, delusional statements, and disorganized behavior noted in the emergency department. Patient acknowledges stress he is unable to give me much valid history. He did express several delusions that while hospitalized in the ER, and he is now sleeping and difficult to arouse.He did receive trazodone and Zyprexa But these were fairly low doses. When I arouse him he can give me mumbling incoherent answers and falls back asleep   Principal Problem: New onset psychosis Diagnosis: Active Problems:   Brief psychotic disorder (HCC)  Total Time spent with patient: 20 minutes  Past Psychiatric History: NO NEW DATA  Past Medical History: History reviewed. No pertinent past medical history. History  reviewed. No pertinent surgical history. Family History: History reviewed. No pertinent family history. Family Psychiatric  History: mother and grandmother have BPAD Social History:  Social History   Substance and Sexual Activity  Alcohol Use Yes  . Frequency: Never   Comment: occassional glass of wine     Social History   Substance and Sexual Activity  Drug Use Never    Social History   Socioeconomic History  . Marital status: Single    Spouse name: Not on file  . Number of children: Not on file  . Years of education: Not on file  . Highest education level: Not on file  Occupational History  . Not on file  Social Needs  . Financial resource strain: Not on file  . Food insecurity:    Worry: Not on file    Inability: Not on file  . Transportation needs:    Medical: Not on file    Non-medical: Not on file  Tobacco Use  . Smoking status: Never Smoker  . Smokeless tobacco: Never Used  Substance and Sexual Activity  . Alcohol use: Yes    Frequency: Never    Comment: occassional glass of wine  . Drug use: Never  . Sexual activity: Yes    Birth control/protection: None  Lifestyle  . Physical activity:    Days per week: Not on file    Minutes per session: Not on file  . Stress: Not on file  Relationships  . Social connections:    Talks on phone: Not on file    Gets together: Not on file    Attends religious service: Not on  file    Active member of club or organization: Not on file    Attends meetings of clubs or organizations: Not on file    Relationship status: Not on file  Other Topics Concern  . Not on file  Social History Narrative  . Not on file   Additional Social History:                         Sleep: Good  Appetite:  Poor  Current Medications: Current Facility-Administered Medications  Medication Dose Route Frequency Provider Last Rate Last Dose  . acetaminophen (TYLENOL) tablet 650 mg  650 mg Oral Q6H PRN Money, Gerlene Burdock, FNP   650  mg at 05/06/18 1046  . alum & mag hydroxide-simeth (MAALOX/MYLANTA) 200-200-20 MG/5ML suspension 30 mL  30 mL Oral Q4H PRN Money, Gerlene Burdock, FNP      . hydrOXYzine (ATARAX/VISTARIL) tablet 25 mg  25 mg Oral TID PRN Money, Gerlene Burdock, FNP   25 mg at 05/04/18 2236  . lactated ringers bolus 1,000 mL  1,000 mL Intravenous Once Virgina Norfolk, DO 983.6 mL/hr at 05/08/18 1215 1,000 mL at 05/08/18 1215  . loperamide (IMODIUM) capsule 2 mg  2 mg Oral PRN Oneta Rack, NP   2 mg at 05/08/18 1020  . magnesium hydroxide (MILK OF MAGNESIA) suspension 30 mL  30 mL Oral Daily PRN Money, Gerlene Burdock, FNP      . omega-3 acid ethyl esters (LOVAZA) capsule 1 g  1 g Oral BID Malvin Johns, MD   1 g at 05/08/18 0803  . prenatal multivitamin tablet 1 tablet  1 tablet Oral Q1200 Malvin Johns, MD   1 tablet at 05/07/18 1200  . risperiDONE (RISPERDAL) tablet 2 mg  2 mg Oral Daily Malvin Johns, MD   2 mg at 05/08/18 0803  . risperiDONE (RISPERDAL) tablet 3 mg  3 mg Oral QHS Malvin Johns, MD   3 mg at 05/07/18 2026  . temazepam (RESTORIL) capsule 30 mg  30 mg Oral QHS Malvin Johns, MD   30 mg at 05/07/18 2026  . traZODone (DESYREL) tablet 50 mg  50 mg Oral QHS PRN Money, Gerlene Burdock, FNP   50 mg at 05/04/18 2235   Current Outpatient Medications  Medication Sig Dispense Refill  . amoxicillin-clavulanate (AUGMENTIN) 875-125 MG tablet     . benzonatate (TESSALON) 100 MG capsule     . ipratropium (ATROVENT) 0.06 % nasal spray     . Omega-3 Fatty Acids (FISH OIL) 1000 MG CAPS Take 1,000 mg by mouth daily.    . promethazine-dextromethorphan (PROMETHAZINE-DM) 6.25-15 MG/5ML syrup     . zolpidem (AMBIEN) 5 MG tablet Take 1 tablet (5 mg total) by mouth at bedtime as needed for sleep. 10 tablet 0    Lab Results: No results found for this or any previous visit (from the past 48 hour(s)).  Blood Alcohol level:  Lab Results  Component Value Date   ETH <10 05/03/2018   ETH <10 05/02/2018    Metabolic Disorder Labs: No results  found for: HGBA1C, MPG No results found for: PROLACTIN No results found for: CHOL, TRIG, HDL, CHOLHDL, VLDL, LDLCALC  Physical Findings: AIMS: Facial and Oral Movements Muscles of Facial Expression: None, normal Lips and Perioral Area: None, normal Jaw: None, normal Tongue: None, normal,Extremity Movements Upper (arms, wrists, hands, fingers): None, normal Lower (legs, knees, ankles, toes): None, normal, Trunk Movements Neck, shoulders, hips: None, normal, Overall Severity Severity of abnormal  movements (highest score from questions above): None, normal Incapacitation due to abnormal movements: None, normal Patient's awareness of abnormal movements (rate only patient's report): No Awareness, Dental Status Current problems with teeth and/or dentures?: No Does patient usually wear dentures?: No  CIWA:  CIWA-Ar Total: 2 COWS:  COWS Total Score: 0  Musculoskeletal: Strength & Muscle Tone: within normal limits Gait & Station: normal Patient leans: N/A  Psychiatric Specialty Exam: Physical Exam  Nursing note and vitals reviewed. Constitutional: He appears well-developed.    Review of Systems  Gastrointestinal: Positive for diarrhea and nausea. Negative for vomiting.  Psychiatric/Behavioral: Positive for depression. Negative for suicidal ideas. The patient is nervous/anxious and has insomnia.   All other systems reviewed and are negative.   Blood pressure (!) 140/91, pulse (!) 114, temperature 98.7 F (37.1 C), temperature source Oral, resp. rate 20, height 6\' 2"  (1.88 m), weight 83 kg, SpO2 99 %.Body mass index is 23.5 kg/m.  General Appearance: Casual  Eye Contact:  Poor  Speech:  Blocked  Volume:  Decreased  Mood:  Anxious and Depressed  Affect:  Congruent  Thought Process:  Disorganized  Orientation:  Other:  Blocking noted  Thought Content:  Paranoid Ideation  Suicidal Thoughts:  No  Homicidal Thoughts:  No  Memory:  Immediate;   Poor  Judgement:  Impaired  Insight:   Shallow  Psychomotor Activity:  Psychomotor Retardation  Concentration:  Concentration: Fair  Recall:  Fair  Fund of Knowledge:  Fair  Language:  Poor  Akathisia:  Negative  Handed:  Right  AIMS (if indicated):     Assets:  Communication Skills  ADL's:  Intact  Cognition:  Impaired,  Mild  Sleep:  Number of Hours: 6.75      Treatment Plan Summary: Daily contact with patient to assess and evaluate symptoms and progress in treatment  Continue with current treatment plan on 05/08/2018 as listed below except were noted  Brief psychotic disorder:   Continue Resperidal  2 mg p.o. Daily + Resperidal 3 mg p.o. Nightly  Continue multivitamin 1 mg p.o. Daily  Continue omega-3 1 g twice daily  Continue Klonopin 0.25 mg   Insomnia:   Continue trazodone 50 mg p.o. nightly as needed  Continue Restoril 30 mg p.o. Nightly  Follow-up emergency department for rehydration. (Medical clearance) tachycardia 140's reported by staff, couplet with diarrhea reported symptoms and decreased fluid intake  CSW to continue working on discharge disposition Patient encouraged to participate throughout the milieu   Oneta Rack, NP 05/08/2018, 12:29 PM ..Agree with NP Progress Note

## 2018-05-08 NOTE — Plan of Care (Signed)
D: Patient presents with thought blocking, paranoid, confused. He has an elevated compensatory pulse of 140 this morning. Fluids given and encouraged. Spoke with provider and will consider sending to ED for fluids if needed/if patient does not increase PO intake. He endorses having AH of "people talking shit about me." He appears to be responding to internal stimuli, as his eyes are darting from side to side during assessment. Patient noted to have elevated liver enzymes and provider T. Melvyn NethLewis, NP notified. Patient denies SI/HI. He slept fair last night, and received medication that was helpful. His appetite is fair, energy low and concentration good. He rates his depression, hopelessness and anxiety 5/10. He complains of diarrhea, and will assess to see if this may be contributing to dehydration. A: Patient checked q15 min, and checks reviewed. Reviewed medication changes with patient and educated on side effects. Educated patient on importance of attending group therapy sessions and educated on several coping skills. Encouarged participation in milieu through recreation therapy and attending meals with peers. Support and encouragement provided. Fluids offered. R: Patient receptive to education on medications, and is medication compliant. Patient contracts for safety on the unit. Goal: "get good enough to leave" and "keep getting good sleep." He reports that "nurses don't want me to leave."  Problem: Coping: Goal: Ability to verbalize frustrations and anger appropriately will improve Outcome: Progressing Goal: Ability to demonstrate self-control will improve Outcome: Progressing   Problem: Education: Goal: Emotional status will improve Outcome: Not Progressing Goal: Mental status will improve Outcome: Not Progressing   Problem: Activity: Goal: Interest or engagement in activities will improve Outcome: Not Progressing Goal: Sleeping patterns will improve Outcome: Not Progressing

## 2018-05-09 MED ORDER — TEMAZEPAM 30 MG PO CAPS: 30.0000 mg | ORAL_CAPSULE | Freq: Every evening | ORAL | 1 refills | Status: AC | PRN

## 2018-05-09 MED ORDER — BENZTROPINE MESYLATE 0.5 MG PO TABS: 1.0000 mg | ORAL_TABLET | Freq: Two times a day (BID) | ORAL | 2 refills | Status: AC

## 2018-05-09 MED ORDER — RISPERIDONE 4 MG PO TABS: 4.0000 mg | ORAL_TABLET | Freq: Every day | ORAL | 2 refills | Status: AC

## 2018-05-09 NOTE — Progress Notes (Signed)
  [facility] :  Will you be returning to the same living situation after discharge:  Yes,  with girlfriend At discharge, do you have transportation home?: Yes,  parents Do you have the ability to pay for your medications: Yes,  [employer]  Release of information consent forms completed and in the chart;  Patient's signature needed at discharge.  Patient to Follow up at: Follow-up Information    [facility] Go on 05/18/2018.   Why:  Your next therapy appointment is Wednesday, 05/18/18 at 9:00a.  Your next medication management is Wednesday, 05/25/18 at 2:00p. Call within 24 hours of discharge to pay $20 deposit.  Bring photo ID, insurance card and social security card.  Contact information: [address]  [place] [id number] [phone]           Next level of care provider [facility] Link:no  Safety Planning and Suicide Prevention discussed: Yes,  with parents     Has patient been referred to the Quitline?: N/A patient is not a smoker  Patient has been referred for addiction treatment: N/A  Lorri FrederickWierda, Omarian Jaquith Jon, LCSW 05/09/2018, 9:37 AM

## 2018-05-09 NOTE — Plan of Care (Signed)
Discharge Note  Patient verbalizes readiness for discharge. Follow up plan explained, AVS, Transition record and SRA given. Prescriptions and teaching provided. Belongings returned and signed for. Suicide safety plan completed and signed. Suicide safety hotline sheet provided. Patient verbalizes understanding. Patient denies SI/HI and assures this Clinical research associatewriter he will seek assistance should that change. Patient discharged to lobby where mother and father was waiting for him.  Problem: Education: Goal: Knowledge of [facility] Education information/materials will improve Outcome: Adequate for Discharge Goal: Emotional status will improve Outcome: Adequate for Discharge Goal: Mental status will improve Outcome: Adequate for Discharge Goal: Verbalization of understanding the information provided will improve Outcome: Adequate for Discharge   Problem: Activity: Goal: Interest or engagement in activities will improve Outcome: Adequate for Discharge Goal: Sleeping patterns will improve Outcome: Adequate for Discharge   Problem: Coping: Goal: Ability to verbalize frustrations and anger appropriately will improve Outcome: Adequate for Discharge Goal: Ability to demonstrate self-control will improve Outcome: Adequate for Discharge   Problem: Health Behavior/Discharge Planning: Goal: Identification of resources available to assist in meeting health care needs will improve Outcome: Adequate for Discharge Goal: Compliance with treatment plan for underlying cause of condition will improve Outcome: Adequate for Discharge   Problem: Physical Regulation: Goal: Ability to maintain clinical measurements within normal limits will improve Outcome: Adequate for Discharge   Problem: Safety: Goal: Periods of time without injury will increase Outcome: Adequate for Discharge   Problem: Education: Goal: Ability to state activities that reduce stress will improve Outcome: Adequate for Discharge    Problem: Coping: Goal: Ability to identify and develop effective coping behavior will improve Outcome: Adequate for Discharge   Problem: Self-Concept: Goal: Ability to identify factors that promote anxiety will improve Outcome: Adequate for Discharge Goal: Level of anxiety will decrease Outcome: Adequate for Discharge Goal: Ability to modify response to factors that promote anxiety will improve Outcome: Adequate for Discharge   Problem: Activity: Goal: Will verbalize the importance of balancing activity with adequate rest periods Outcome: Adequate for Discharge   Problem: Education: Goal: Will be free of psychotic symptoms Outcome: Adequate for Discharge Goal: Knowledge of the prescribed therapeutic regimen will improve Outcome: Adequate for Discharge   Problem: Coping: Goal: Coping ability will improve Outcome: Adequate for Discharge Goal: Will verbalize feelings Outcome: Adequate for Discharge   Problem: Health Behavior/Discharge Planning: Goal: Compliance with prescribed medication regimen will improve Outcome: Adequate for Discharge   Problem: Nutritional: Goal: Ability to achieve adequate nutritional intake will improve Outcome: Adequate for Discharge   Problem: Role Relationship: Goal: Ability to communicate needs accurately will improve Outcome: Adequate for Discharge Goal: Ability to interact with others will improve Outcome: Adequate for Discharge   Problem: Safety: Goal: Ability to redirect hostility and anger into socially appropriate behaviors will improve Outcome: Adequate for Discharge Goal: Ability to remain free from injury will improve Outcome: Adequate for Discharge   Problem: Self-Care: Goal: Ability to participate in self-care as condition permits will improve Outcome: Adequate for Discharge   Problem: Self-Concept: Goal: Will verbalize positive feelings about self Outcome: Adequate for Discharge

## 2018-05-09 NOTE — Discharge Summary (Signed)
Physician Discharge Summary Note  Patient:  James Burgess is an 27 y.o., male MRN:  [id number] DOB:  1991-01-08 Patient phone:  [phone] (home)  Patient address:   [address] [place] [address],  Total Time spent with patient: 45 minutes  Date of Admission:  05/04/2018 Date of Discharge: 05/09/18  Reason for Admission: New onset psychotic symptoms  Principal Problem: New onset psychotic symptoms in the context of 5 days of insomnia and a negative drug screen Discharge Diagnoses: Active Problems:   Brief psychotic disorder [facility])   Past Psychiatric History: neg Past Medical History: History reviewed. No pertinent past medical history. History reviewed. No pertinent surgical history. Family History: History reviewed. No pertinent family history. Family Psychiatric  History: mo/ grandmother bipolar Social History:  Social History   Substance and Sexual Activity  Alcohol Use Yes  . Frequency: Never   Comment: occassional glass of wine     Social History   Substance and Sexual Activity  Drug Use Never    Social History   Socioeconomic History  . Marital status: Single    Spouse name: Not on file  . Number of children: Not on file  . Years of education: Not on file  . Highest education level: Not on file  Occupational History  . Not on file  Social Needs  . Financial resource strain: Not on file  . Food insecurity:    Worry: Not on file    Inability: Not on file  . Transportation needs:    Medical: Not on file    Non-medical: Not on file  Tobacco Use  . Smoking status: Never Smoker  . Smokeless tobacco: Never Used  Substance and Sexual Activity  . Alcohol use: Yes    Frequency: Never    Comment: occassional glass of wine  . Drug use: Never  . Sexual activity: Yes    Birth control/protection: None  Lifestyle  . Physical activity:    Days per week: Not on file    Minutes per session: Not on file  . Stress: Not on file  Relationships  . Social  connections:    Talks on phone: Not on file    Gets together: Not on file    Attends religious service: Not on file    Active member of club or organization: Not on file    Attends meetings of clubs or organizations: Not on file    Relationship status: Not on file  Other Topics Concern  . Not on file  Social History Narrative  . Not on file    Hospital Course:    As discussed James Burgess was 27 years of age presenting with new onset psychosis in the context of a strong family history of bipolar disorder (mother and grandmother) and negative drug screen and 5 days of insomnia. Once here he was quite disorganized and bizarre initially, and though he always denied hallucinations his eyes were dart around his if he was seeing things at times Risperdal was chosen as his main antipsychotic Restoril was used to ensure sleep and over the weekend he finally did organized what was thought to be at baseline status By the date of the second he was alert oriented to person place time situation, reading a book that was a nonfiction book of some degree of complexity, and did seem very stable in mood and affect. He felt he was well enough to stop medications but we told him it would be best to stay on the medications, at  least 6 months and there are neuro protective measures we recommend to include B vitamins omega-3's and antioxidants.  For the time being his diagnosis remains schizophreniform disorder Physical Findings: AIMS: Facial and Oral Movements Muscles of Facial Expression: None, normal Lips and Perioral Area: None, normal Jaw: None, normal Tongue: None, normal,Extremity Movements Upper (arms, wrists, hands, fingers): None, normal Lower (legs, knees, ankles, toes): None, normal, Trunk Movements Neck, shoulders, hips: None, normal, Overall Severity Severity of abnormal movements (highest score from questions above): None, normal Incapacitation due to abnormal movements: None, normal Patient's  awareness of abnormal movements (rate only patient's report): No Awareness, Dental Status Current problems with teeth and/or dentures?: No Does patient usually wear dentures?: No  CIWA:  CIWA-Ar Total: 2 COWS:  COWS Total Score: 0 Musculoskeletal: Strength & Muscle Tone: within normal limits Gait & Station: normal Patient leans: N/A  Psychiatric Specialty Exam: ROS  Blood pressure 117/73, pulse (!) 143, temperature 98.1 F (36.7 C), temperature source Oral, resp. rate 15, height 6\' 2"  (1.88 m), weight 83 kg, SpO2 97 %.Body mass index is 23.5 kg/m.  General Appearance: casual  Eye Contact::  Good  Speech:  Clear and Coherent409  Volume:  Normal  Mood:  Euthymic  Affect:  Appropriate  Thought Process:  Coherent  Orientation:  Full (Time, Place, and Person)  Thought Content:  coherent  Suicidal Thoughts:  No  Homicidal Thoughts:  No  Memory:  Immediate;   Good  Judgement:  Intact  Insight:  Good  Psychomotor Activity:  Normal  Concentration:  Good  Recall:  Good  Fund of Knowledge:Good  Language: Good  Akathisia:  Negative    AIMS (if indicated):     Assets:  Communication Skills  Sleep:  Number of Hours: 6.75  Cognition: WNL  ADL's:  Intact      Has this patient used any form of tobacco in the last 30 days? (Cigarettes, Smokeless Tobacco, Cigars, and/or Pipes) Yes, No  Blood Alcohol level:  Lab Results  Component Value Date   ETH <10 05/03/2018   ETH <10 05/02/2018    Metabolic Disorder Labs:  No results found for: HGBA1C, MPG No results found for: PROLACTIN No results found for: CHOL, TRIG, HDL, CHOLHDL, VLDL, LDLCALC  See Psychiatric Specialty Exam and Suicide Risk Assessment completed by Attending Physician prior to discharge.  Discharge destination:  Home  Is patient on multiple antipsychotic therapies at discharge:  No   Has Patient had three or more failed trials of antipsychotic monotherapy by history:  No  Recommended Plan for Multiple  Antipsychotic Therapies: NA   Allergies as of 05/09/2018   No Known Allergies     Medication List    STOP taking these medications   amoxicillin-clavulanate 875-125 MG tablet Commonly known as:  AUGMENTIN   benzonatate 100 MG capsule Commonly known as:  TESSALON   ipratropium 0.06 % nasal spray Commonly known as:  ATROVENT   promethazine-dextromethorphan 6.25-15 MG/5ML syrup Commonly known as:  PROMETHAZINE-DM   zolpidem 5 MG tablet Commonly known as:  AMBIEN     TAKE these medications     Indication  benztropine 0.5 MG tablet Commonly known as:  COGENTIN Take 2 tablets (1 mg total) by mouth 2 (two) times daily.  Indication:  Extrapyramidal Reaction caused by Medications   Fish Oil 1000 MG Caps Take 1,000 mg by mouth daily.  Indication:  Tiredness   risperidone 4 MG tablet Commonly known as:  RISPERDAL Take 1 tablet (4 mg total) by  mouth at bedtime.  Indication:  Psychosis   temazepam 30 MG capsule Commonly known as:  RESTORIL Take 1 capsule (30 mg total) by mouth at bedtime as needed for sleep.  Indication:  Trouble Sleeping      Follow-up Information    [facility]   Specialty:  Emergency Medicine Why:  As needed, If symptoms worsen Contact information: [address] [id number] [place] [address] [phone]          Follow-up recommendations:  Activity:  full  SignedMalvin Johns, MD 05/09/2018, 8:45 AM

## 2018-05-09 NOTE — [facility] Suicide Risk Assessment (Signed)
[facility]   Principal Problem: Insomnia/new onset psychosis Discharge Diagnoses: Active Problems:   Brief psychotic disorder (HCC)   Total Time spent with patient: 45 minutes  Musculoskeletal: Strength & Muscle Tone: within normal limits Gait & Station: normal Patient leans: N/A  Psychiatric Specialty Exam: ROS  Blood pressure 117/73, pulse (!) 143, temperature 98.1 F (36.7 C), temperature source Oral, resp. rate 15, height 6\' 2"  (1.88 m), weight 83 kg, SpO2 97 %.Body mass index is 23.5 kg/m.  General Appearance: casual  Eye Contact::  Good  Speech:  Clear and Coherent409  Volume:  Normal  Mood:  Euthymic  Affect:  Appropriate  Thought Process:  Coherent  Orientation:  Full (Time, Place, and Person)  Thought Content:  coherent  Suicidal Thoughts:  No  Homicidal Thoughts:  No  Memory:  Immediate;   Good  Judgement:  Intact  Insight:  Good  Psychomotor Activity:  Normal  Concentration:  Good  Recall:  Good  Fund of Knowledge:Good  Language: Good  Akathisia:  Negative    AIMS (if indicated):     Assets:  Communication Skills  Sleep:  Number of Hours: 6.75  Cognition: WNL  ADL's:  Intact   Mental Status Per Nursing Assessment::   On Admission:  NA  Demographic Factors:  Male  Loss Factors: NA  Historical Factors: NA  Risk Reduction Factors:   Employed  Continued Clinical Symptoms:  Schizophrenia:   Less than 28 years old  Cognitive Features That Contribute To Risk:  None    Suicide Risk:  Minimal: No identifiable suicidal ideation.  Patients presenting with no risk factors but with morbid ruminations; may be classified as minimal risk based on the severity of the depressive symptoms  Follow-up Information     [facility]   Specialty:  Emergency Medicine Why:  As needed, If symptoms worsen Contact information: [id number] [place]  [id number]          Plan Of Care/Follow-up recommendations:  See orders  Malvin JohnsFARAH,Camree Wigington, MD 05/09/2018, 8:18 AM

## 2018-05-09 NOTE — Progress Notes (Signed)
Recreation Therapy Notes  Date: 12.2.19 Time: 1000 Location: 500 Hall Dayroom  Group Topic: Coping Skills  Goal Area(s) Addresses:  Patient will be able to identify positive coping skills. Patient will be able to identify the benefit of using coping skills post d/c.  Intervention: Worksheet  Activity: Web Design.  Patients were to identify the things that have kept them "stuck" and write them inside the spider web.  Patients were to then write as many positive coping skills they could think of on the outside of the web.  Education: Coping Skills, Discharge Planning.   Education Outcome: Acknowledges understanding/In group clarification offered/Needs additional education.   Clinical Observations/Feedback: Pt did not attend group.     Dawnita Molner, LRT/CTRS         Farhan Jean A 05/09/2018 11:48 AM 

## 2018-05-09 NOTE — Progress Notes (Signed)
Patient had parents and girlfriend to visit tonight. He attended group and was compliant with his medications. He reports feeling better since getting fluids at the ED, He took his medications early and went to his room to rest. He still has thought blocking during conversation. Safety maintained on unit with 15 min checks.

## 2018-05-09 NOTE — [facility] Suicide Risk Assessment (Signed)
[facility] INPATIENT:  Family/Significant Other Suicide Prevention Education  Suicide Prevention Education:  Education Completed; James Burgess and James Burgess, parents, [phone], has been identified by the patient as the family member/significant other with whom the patient will be residing, and identified as the person(s) who will aid the patient in the event of a mental health crisis (suicidal ideations/suicide attempt).  With written consent from the patient, the family member/significant other has been provided the following suicide prevention education, prior to the and/or following the discharge of the patient.  The suicide prevention education provided includes the following:  Suicide risk factors  Suicide prevention and interventions  National Suicide Hotline telephone number  [facility] assessment telephone number  [facility] Assistance 911  [facility] and/or [facility] telephone number  Request made of family/significant other to:  Remove weapons (e.g., guns, rifles, knives), all items previously/currently identified as safety concern.   No guns involved that parents are aware of.  Remove drugs/medications (over-the-counter, prescriptions, illicit drugs), all items previously/currently identified as a safety concern.  The family member/significant other verbalizes understanding of the suicide prevention education information provided.  The family member/significant other agrees to remove the items of safety concern listed above.  [place] two [place] and pt sister may come down for a week after that to make sure pt has good support.    James Burgess, James Burgess Jon, LCSW 05/09/2018, 9:07 AM

## 2020-01-19 IMAGING — CT CT HEAD W/O CM
3 series · 15 of 47 positions shown, 18 images · non-contrast
Comparison: None.

CLINICAL DATA: Altered LOC

EXAM:
CT HEAD WITHOUT CONTRAST
TECHNIQUE: Contiguous axial images were obtained from the base of the skull
through the vertex without intravenous contrast.

[Series 2: head wo · axial · 0.45mm/px · z∈[-197,-72]mm · 9 of 31 slices shown, 12 images]
[im 3/31  brain]
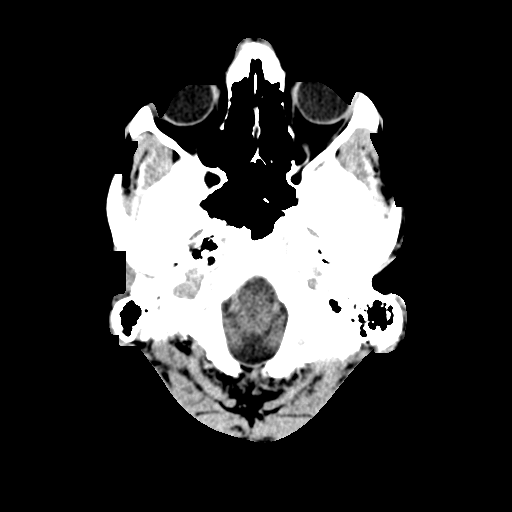
[im 3/31  bone]
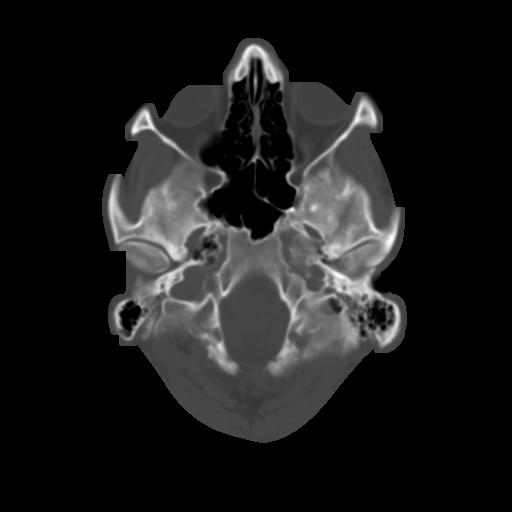
[im 6/31  brain]
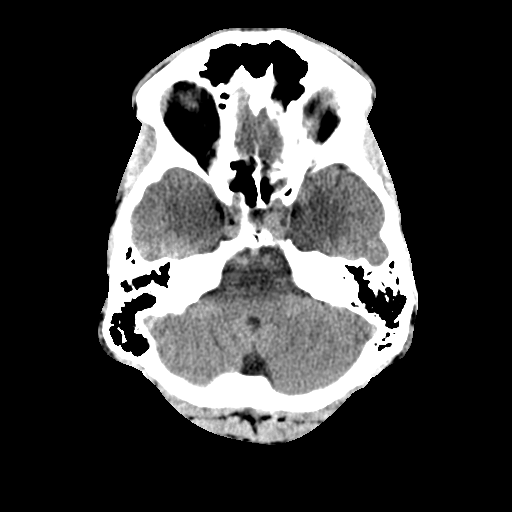
[im 9/31  brain]
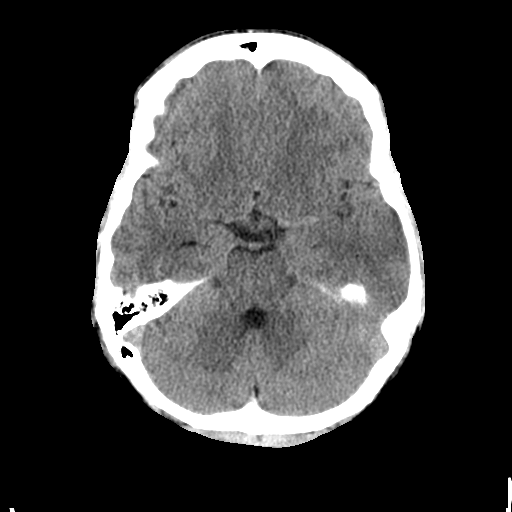
[im 12/31  brain]
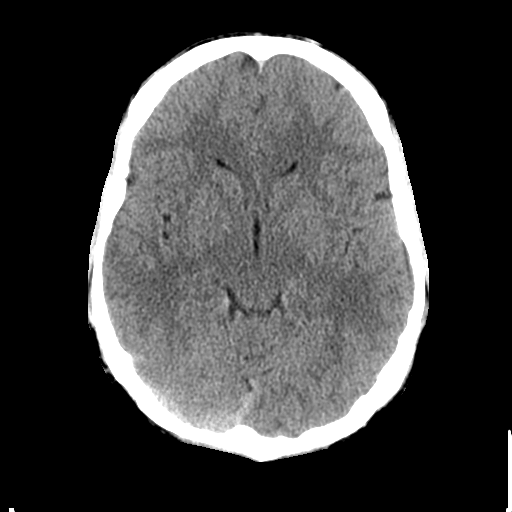
[im 16/31  brain]
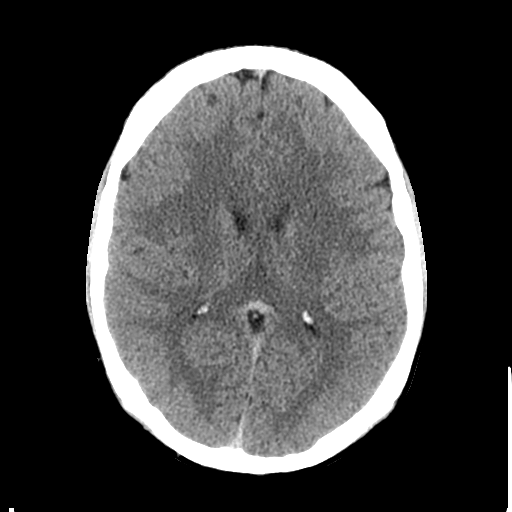
[im 16/31  bone]
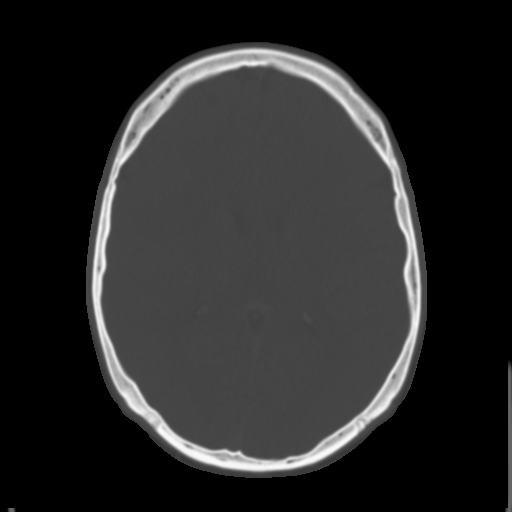
[im 19/31  brain]
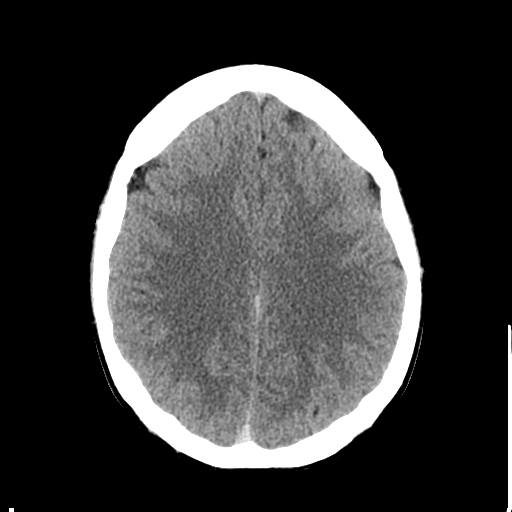
[im 22/31  brain]
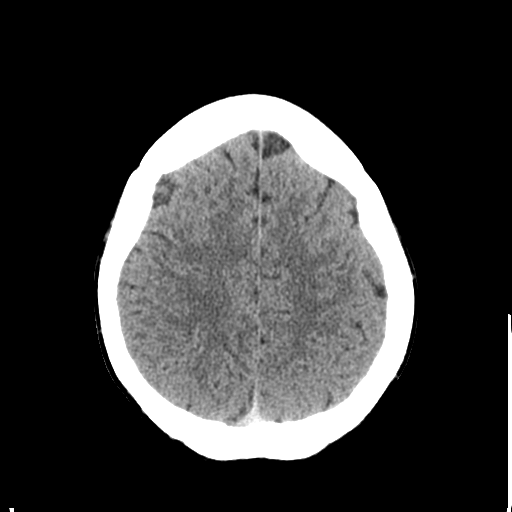
[im 25/31  brain]
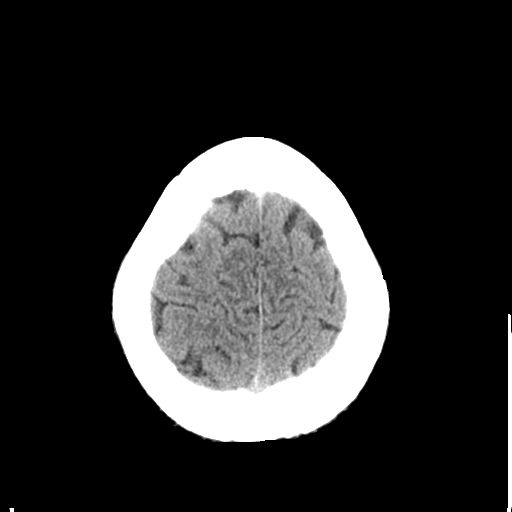
[im 28/31  brain]
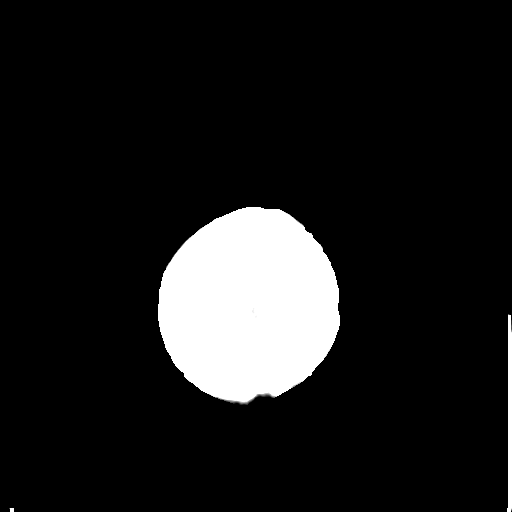
[im 28/31  bone]
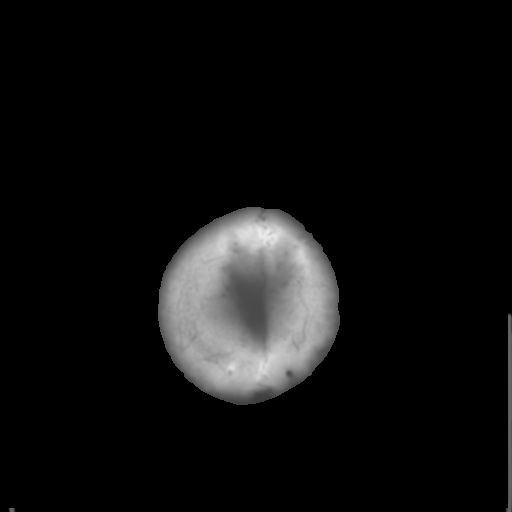

[Series 5: coronal soft tissue · coronal · 0.30mm/px · 3 of 68 slices shown]
[im 23/68  brain]
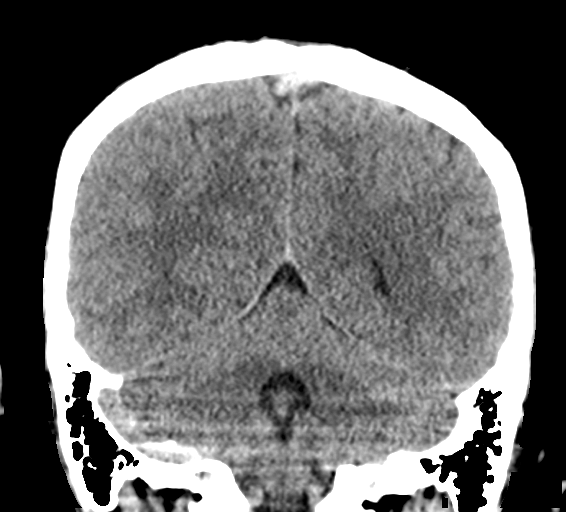
[im 30/68  brain]
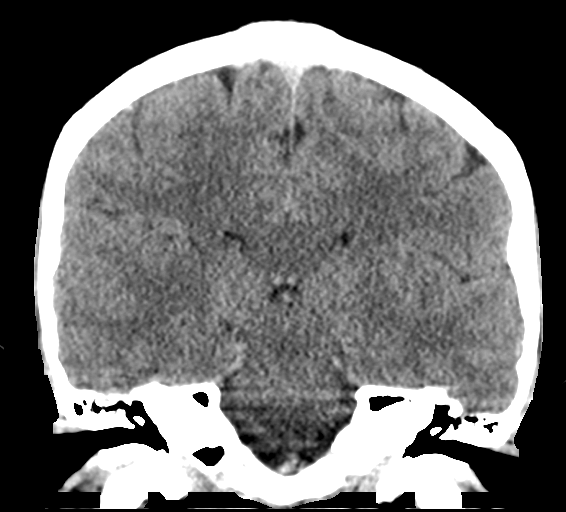
[im 38/68  brain]
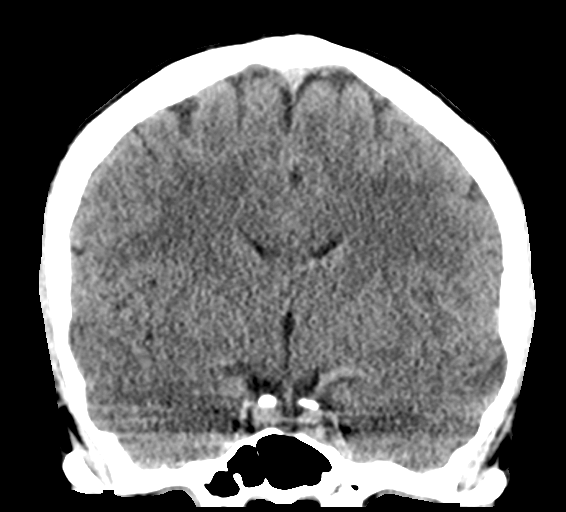

[Series 6: sagittal soft tissue · sagittal · 0.30mm/px · 3 of 57 slices shown]
[im 19/57  brain]
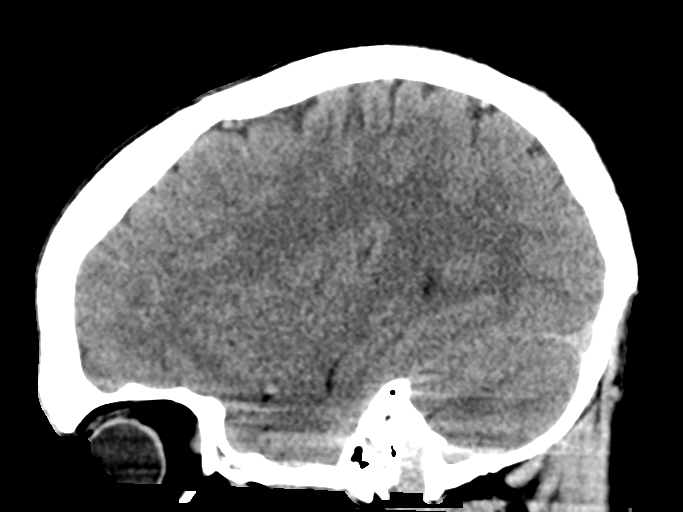
[im 29/57  brain]
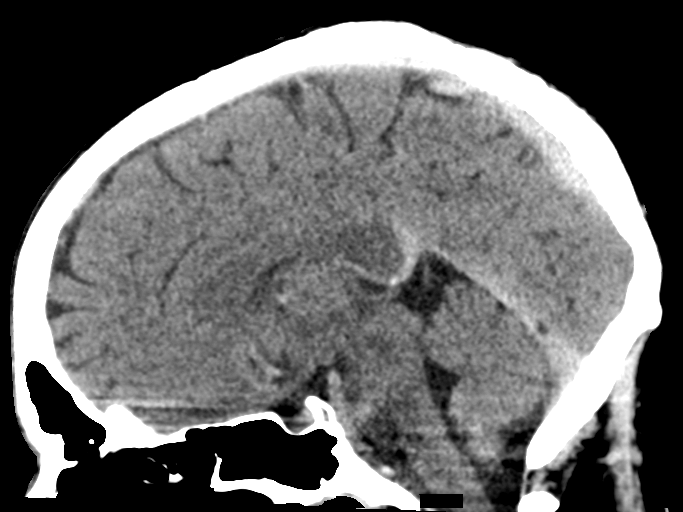
[im 38/57  brain]
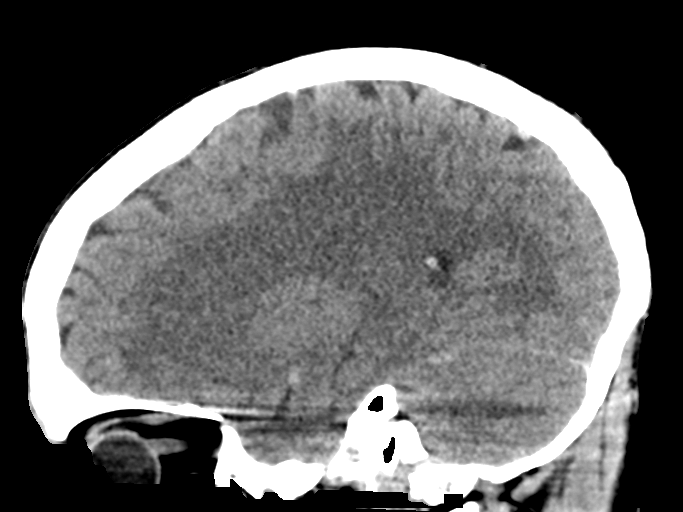

[15 of 47 positions shown; findings below may reference images not displayed]

FINDINGS: Brain: No evidence of acute infarction, hemorrhage, hydrocephalus,
extra-axial collection or mass lesion/mass effect.

Vascular: No hyperdense vessel or unexpected calcification.

Skull: Normal. Negative for fracture or focal lesion.

Sinuses/Orbits: No acute finding.

Other: None
IMPRESSION: Negative non contrasted CT appearance of the brain

## 2023-10-31 ENCOUNTER — Encounter: Payer: Self-pay | Admitting: Emergency Medicine

## 2023-10-31 ENCOUNTER — Ambulatory Visit
Admission: EM | Admit: 2023-10-31 | Discharge: 2023-10-31 | Disposition: A | Discharge status: Home / Self Care | Payer: Self-pay | Attending: Internal Medicine | Admitting: Internal Medicine

## 2023-10-31 DIAGNOSIS — S61211A Laceration without foreign body of left index finger without damage to nail, initial encounter: Secondary | ICD-10-CM

## 2023-10-31 DIAGNOSIS — Z23 Encounter for immunization: Secondary | ICD-10-CM

## 2023-10-31 MED ORDER — TETANUS-DIPHTH-ACELL PERTUSSIS 5-2.5-18.5 LF-MCG/0.5 IM SUSY
0.5000 mL | PREFILLED_SYRINGE | Freq: Once | INTRAMUSCULAR | Status: AC
  Administered 2023-10-31: 0.5 mL via INTRAMUSCULAR

## 2023-10-31 NOTE — Discharge Instructions (Signed)
 Wound care: Please keep the area surrounding the wound/sutures clean and dry for the next 24 hours. After 24 hours, you may get the wound wet. Gently clean wound with antibacterial soap. Do not scrub wound.   You should have the sutures removed in 10 days by your primary care provider or at urgent care. Return sooner than 10 days if you experience discharge from your laceration, redness around your laceration, warmth around your laceration, or fever.   You may take over the counter medicines as needed for aches and pains once the numbing wears off.   Thanks for letting me fix your cut today!

## 2023-10-31 NOTE — ED Triage Notes (Signed)
 Pt presents with laceration to left pointer finger that occurred today while getting tongs out of dishwasher.

## 2023-10-31 NOTE — ED Provider Notes (Signed)
 [facility]    CSN: [id number] Arrival date & time: 10/31/23  1353      History   Chief Complaint Chief Complaint  Patient presents with   Laceration    HPI James Burgess is a 33 y.o. male.   James Burgess is a 33 y.o. male presenting for chief complaint of Laceration to the left pointer finger that happened approximately 1 hour prior to arrival. He was removing a pair of metal grilling tons from the dishwasher when he accidentally cut  the pad of the left pointer finger on a sharp edge. Wound bled initially, bleeding controlled with pressure. No damage to the nail, denies numbness and tingling distally to injury. He is unable to recall date of last tetanus injection.      History reviewed. No pertinent past medical history.  Patient Active Problem List   Diagnosis Date Noted   Brief psychotic disorder (HCC) 05/04/2018    History reviewed. No pertinent surgical history.     Home Medications    Prior to Admission medications   Medication Sig Start Date End Date Taking? Authorizing Provider  benztropine  (COGENTIN ) 0.5 MG tablet Take 2 tablets (1 mg total) by mouth 2 (two) times daily. 05/09/18 05/09/19  Suzi Essex, MD  Omega-3 Fatty Acids (FISH OIL) 1000 MG CAPS Take 1,000 mg by mouth daily.    [provider]  risperiDONE  (RISPERDAL ) 4 MG tablet Take 1 tablet (4 mg total) by mouth at bedtime. 05/09/18   Suzi Essex, MD  temazepam  (RESTORIL ) 30 MG capsule Take 1 capsule (30 mg total) by mouth at bedtime as needed for sleep. 05/09/18   Suzi Essex, MD    Family History History reviewed. No pertinent family history.  Social History Social History   Tobacco Use   Smoking status: Never   Smokeless tobacco: Never  Vaping Use   Vaping status: Never Used  Substance Use Topics   Alcohol use: Yes    Comment: occassional glass of wine   Drug use: Never     Allergies   Patient has no known allergies.   Review of Systems Review of  Systems Per HPI  Physical Exam Triage Vital Signs ED Triage Vitals  Encounter Vitals Group     BP 10/31/23 1403 128/73     Systolic BP Percentile --      Diastolic BP Percentile --      Pulse Rate 10/31/23 1403 63     Resp 10/31/23 1403 16     Temp --      Temp src --      SpO2 10/31/23 1403 97 %     Weight --      Height --      Head Circumference --      Peak Flow --      Pain Score 10/31/23 1404 3     Pain Loc --      Pain Education --      Exclude from Growth Chart --    No data found.  Updated Vital Signs BP 128/73 (BP Location: Right Arm)   Pulse 63   Resp 16   SpO2 97%   Visual Acuity Right Eye Distance:   Left Eye Distance:   Bilateral Distance:    Right Eye Near:   Left Eye Near:    Bilateral Near:     Physical Exam Vitals and nursing note reviewed.  Constitutional:      Appearance: He is not ill-appearing or toxic-appearing.  HENT:     Head: Normocephalic and atraumatic.     Right Ear: Hearing and external ear normal.     Left Ear: Hearing and external ear normal.     Nose: Nose normal.     Mouth/Throat:     Lips: Pink.  Eyes:     General: Lids are normal. Vision grossly intact. Gaze aligned appropriately.     Extraocular Movements: Extraocular movements intact.     Conjunctiva/sclera: Conjunctivae normal.  Pulmonary:     Effort: Pulmonary effort is normal.  Musculoskeletal:     Left hand: Laceration (1.5-2cm laceration to the pad of the left pointer finger as seen in image below.) present. No swelling, deformity, tenderness or bony tenderness. Normal range of motion. Normal strength. Normal sensation. There is no disruption of two-point discrimination. Normal capillary refill. Normal pulse.     Cervical back: Neck supple.     Comments: +2 left radial pulse, cap refill less than 2 to affected digit, sensation and strength intact distally to injury. Full ROM of left pointer finger.   Skin:    General: Skin is warm and dry.     Capillary Refill:  Capillary refill takes less than 2 seconds.     Findings: No rash.  Neurological:     General: No focal deficit present.     Mental Status: He is alert and oriented to person, place, and time. Mental status is at baseline.     Cranial Nerves: No dysarthria or facial asymmetry.  Psychiatric:        Mood and Affect: Mood normal.        Speech: Speech normal.        Behavior: Behavior normal.        Thought Content: Thought content normal.        Judgment: Judgment normal.      UC Treatments / Results  Labs (all labs ordered are listed, but only abnormal results are displayed) Labs Reviewed - No data to display  EKG   Radiology No results found.  Procedures Laceration Repair  Date/Time: 10/31/2023 3:26 PM  Performed by: Starlene Eaton, FNP Authorized by: Starlene Eaton, FNP   Consent:    Consent obtained:  Verbal   Consent given by:  Patient   Risks, benefits, and alternatives were discussed: yes     Risks discussed:  Infection, need for additional repair, nerve damage, pain, poor cosmetic result, poor wound healing, retained foreign body, tendon damage and vascular damage   Alternatives discussed:  No treatment Universal protocol:    Patient identity confirmed:  Verbally with patient Anesthesia:    Anesthesia method:  Local infiltration   Local anesthetic:  Lidocaine 1% w/o epi Laceration details:    Location:  Finger   Finger location:  L index finger   Length (cm):  1.5   Depth (mm):  5 Exploration:    Wound exploration: wound explored through full range of motion and entire depth of wound visualized   Treatment:    Area cleansed with:  Povidone-iodine and Shur-Clens   Amount of cleaning:  Standard Skin repair:    Repair method:  Sutures   Suture size:  5-0   Suture material:  Prolene   Suture technique:  Simple interrupted   Number of sutures:  3 Approximation:    Approximation:  Close Repair type:    Repair type:  Simple Post-procedure  details:    Dressing:  Non-adherent dressing   Procedure completion:  Tolerated well,  no immediate complications  (including critical care time)  Medications Ordered in UC Medications  Tdap (BOOSTRIX) injection 0.5 mL (0.5 mLs Intramuscular Given 10/31/23 1446)    Initial Impression / Assessment and Plan / UC Course  I have reviewed the triage vital signs and the nursing notes.  Pertinent labs & imaging results that were available during my care of the patient were reviewed by me and considered in my medical decision making (see chart for details).   1. Laceration of left index finger without foreign body without damage to nail, need for tetanus booster Laceration repaired, see procedure note above for details. Discussed wound care and cleaning at home.  Imaging: no clinical indication Infection return precautions discussed.  Suture removal in 10 days.  Tdap updated today.  Tylenol  as needed for pain at home.  Advised to rest and avoid activities that may increase tension to wound/sutures or expose wound to infection. Excuse note given.   Counseled patient on potential for adverse effects with medications prescribed/recommended today, strict ER and return-to-clinic precautions discussed, patient verbalized understanding.    Final Clinical Impressions(s) / UC Diagnoses   Final diagnoses:  Laceration of left index finger without foreign body without damage to nail, initial encounter  Need for tetanus booster     Discharge Instructions      Wound care: Please keep the area surrounding the wound/sutures clean and dry for the next 24 hours. After 24 hours, you may get the wound wet. Gently clean wound with antibacterial soap. Do not scrub wound.   You should have the sutures removed in 10 days by your primary care provider or at urgent care. Return sooner than 10 days if you experience discharge from your laceration, redness around your laceration, warmth around your laceration, or  fever.   You may take over the counter medicines as needed for aches and pains once the numbing wears off.   Thanks for letting me fix your cut today!    ED Prescriptions   None    PDMP not reviewed this encounter.   Starlene Eaton, [place] 11/02/23 2028
